# Patient Record
Sex: Female | Born: 1975 | Race: White | Hispanic: No | Marital: Married | Smoking: Former smoker
Health system: Southern US, Community
[De-identification: ages and names within clinical notes are randomized; demographics above are authoritative.]

## PROBLEM LIST (undated history)

## (undated) DIAGNOSIS — F32A Depression, unspecified: Secondary | ICD-10-CM

## (undated) DIAGNOSIS — K5792 Diverticulitis of intestine, part unspecified, without perforation or abscess without bleeding: Secondary | ICD-10-CM

## (undated) DIAGNOSIS — F419 Anxiety disorder, unspecified: Secondary | ICD-10-CM

## (undated) DIAGNOSIS — K219 Gastro-esophageal reflux disease without esophagitis: Secondary | ICD-10-CM

## (undated) HISTORY — DX: Diverticulitis of intestine, part unspecified, without perforation or abscess without bleeding: K57.92

## (undated) HISTORY — DX: Anxiety disorder, unspecified: F41.9

## (undated) HISTORY — DX: Gastro-esophageal reflux disease without esophagitis: K21.9

## (undated) HISTORY — DX: Depression, unspecified: F32.A

---

## 2013-05-21 HISTORY — PX: REFRACTIVE SURGERY: SHX103

## 2018-05-21 HISTORY — PX: COLONOSCOPY: SHX174

## 2020-11-18 HISTORY — PX: ESOPHAGOGASTRODUODENOSCOPY: SHX1529

## 2021-03-20 ENCOUNTER — Encounter: Payer: Self-pay | Admitting: Family Medicine

## 2021-03-20 ENCOUNTER — Other Ambulatory Visit: Payer: Self-pay

## 2021-03-20 ENCOUNTER — Ambulatory Visit
Admission: RE | Admit: 2021-03-20 | Discharge: 2021-03-20 | Disposition: A | Discharge status: Home / Self Care | Payer: BC Managed Care – PPO | Attending: Family Medicine | Admitting: Family Medicine

## 2021-03-20 ENCOUNTER — Ambulatory Visit (INDEPENDENT_AMBULATORY_CARE_PROVIDER_SITE_OTHER): Payer: BC Managed Care – PPO | Admitting: Family Medicine

## 2021-03-20 ENCOUNTER — Ambulatory Visit
Admission: RE | Admit: 2021-03-20 | Discharge: 2021-03-20 | Disposition: A | Discharge status: Home / Self Care | Payer: BC Managed Care – PPO | Source: Ambulatory Visit | Attending: Family Medicine | Admitting: Family Medicine

## 2021-03-20 VITALS — BP 136/92 | HR 60 | Temp 97.9°F | Ht 66.0 in | Wt 187.0 lb

## 2021-03-20 DIAGNOSIS — M25551 Pain in right hip: Secondary | ICD-10-CM

## 2021-03-20 DIAGNOSIS — R030 Elevated blood-pressure reading, without diagnosis of hypertension: Secondary | ICD-10-CM | POA: Insufficient documentation

## 2021-03-20 DIAGNOSIS — M160 Bilateral primary osteoarthritis of hip: Secondary | ICD-10-CM | POA: Diagnosis not present

## 2021-03-20 DIAGNOSIS — M25552 Pain in left hip: Secondary | ICD-10-CM

## 2021-03-20 MED ORDER — MELOXICAM 15 MG PO TABS: 15.0000 mg | ORAL_TABLET | Freq: Every day | ORAL | 0 refills | Status: DC

## 2021-03-20 NOTE — Patient Instructions (Signed)
-   Obtain x-rays downstairs - Start meloxicam, dose daily with food x2 weeks, then daily as needed - If any stomach upset or increased bruising noted, stop meloxicam and contact our office - Can contact number below to start physical therapy - Return for follow-up in 5 weeks, contact us for any questions between now and then  [facility] Physical Therapy:  [place]:  [phone]

## 2021-03-20 NOTE — Assessment & Plan Note (Signed)
Chronic issue in bilateral hips that is somewhat stable, right with possible exacerbation.  Has previous MRI and [place], per patient, showing osteoarthritis.  Pain is primarily anterior without radiation, noted with increasing activity towards the end of the day, treatment in the past has included PT and rest.  Examination today shows significant reduction internal rotation of the right, limited on the left, tenderness at the right greater trochanter and throughout the gluteus medius, secondarily to the left greater than right SI joints, negative straight leg raise, FADIR testing without pain though with limitation on ROM.  Plan for dedicated updated x-rays of bilateral hips and AP pelvis, initiation of 2 weeks of meloxicam followed by as needed dosing, formal PT, and follow-up in 5 weeks to assess interval change.  Pending x-rays and clinical picture, can further escalate with continued medications, change in medications, local ultrasound-guided injections.

## 2021-03-20 NOTE — Assessment & Plan Note (Signed)
Asymptomatic from a cardiopulmonary standpoint, examination reveals no bruits, JVD, positive S1 and S2, regular rate and rhythm, no additional heart sounds, clear lung fields bilaterally without wheezes, rales, rhonchi.  We will monitor this issue at her return in 5 weeks for her annual physical.

## 2021-03-20 NOTE — Progress Notes (Signed)
Primary Care / Sports Medicine Office Visit  Patient Information:  Patient ID: Alexandra Salinas, female DOB: 09-Jun-1975 Age: 45 y.o. MRN: [id number]   Alexandra Salinas is a pleasant 45 y.o. female presenting with the following:  Chief Complaint  Patient presents with   New Patient (Initial Visit)   Establish Care    Moved from [place] 12/2020   Gastroesophageal Reflux    EGD 11/2020; taking famotidine 20 mg bid with relief   Hip Pain    Bilateral; x8 years; MRI 6 years ago and was diagnosed with congenital osteoarthritis; not currently on treatment, but history of doing PT; limited ROM; denies pain in office    Review of Systems pertinent details above   Patient Active Problem List   Diagnosis Date Noted   Pain of both hip joints 03/20/2021   Elevated systolic blood pressure reading without diagnosis of hypertension 03/20/2021   Past Medical History:  Diagnosis Date   Anxiety    Depression    Diverticulitis    GERD (gastroesophageal reflux disease)    Outpatient Encounter Medications as of 03/20/2021  Medication Sig   busPIRone (BUSPAR) 5 MG tablet Take 5 mg by mouth 2 (two) times daily.   escitalopram (LEXAPRO) 20 MG tablet Take 20 mg by mouth daily. Dr. Kenn File with [facility]   famotidine (PEPCID) 20 MG tablet Take 20 mg by mouth 2 (two) times daily.   meloxicam (MOBIC) 15 MG tablet Take 1 tablet (15 mg total) by mouth daily.   No facility-administered encounter medications on file as of 03/20/2021.   Past Surgical History:  Procedure Laterality Date   CESAREAN SECTION  2021   COLONOSCOPY  2020   ESOPHAGOGASTRODUODENOSCOPY  11/2020   REFRACTIVE SURGERY Bilateral 2015    Vitals:   03/20/21 0833  BP: (!) 136/92  Pulse: 60  Temp: 97.9 F (36.6 C)  SpO2: 98%   Vitals:   03/20/21 0833  Weight: 187 lb (84.8 kg)  Height: 5\' 6"  (1.676 m)   Body mass index is 30.18 kg/m.  No results found.   Independent interpretation of notes and tests performed  by another provider:   None  Procedures performed:   None  Pertinent History, Exam, Impression, and Recommendations:   Elevated systolic blood pressure reading without diagnosis of hypertension Asymptomatic from a cardiopulmonary standpoint, examination reveals no bruits, JVD, positive S1 and S2, regular rate and rhythm, no additional heart sounds, clear lung fields bilaterally without wheezes, rales, rhonchi.  We will monitor this issue at her return in 5 weeks for her annual physical.  Pain of both hip joints Chronic issue in bilateral hips that is somewhat stable, right with possible exacerbation.  Has previous MRI and x-ray [place]state, per patient, showing osteoarthritis.  Pain is primarily anterior without radiation, noted with increasing activity towards the end of the day, treatment in the past has included PT and rest.  Examination today shows significant reduction internal rotation of the right, limited on the left, tenderness at the right greater trochanter and throughout the gluteus medius, secondarily to the left greater than right SI joints, negative straight leg raise, FADIR testing without pain though with limitation on ROM.  Plan for dedicated updated x-rays of bilateral hips and AP pelvis, initiation of 2 weeks of meloxicam followed by as needed dosing, formal PT, and follow-up in 5 weeks to assess interval change.  Pending x-rays and clinical picture, can further escalate with continued medications, change in medications, local ultrasound-guided injections.  Orders & Medications Meds ordered this encounter  Medications   meloxicam (MOBIC) 15 MG tablet    Sig: Take 1 tablet (15 mg total) by mouth daily.    Dispense:  30 tablet    Refill:  0   Orders Placed This Encounter  Procedures   DG Hip Unilat W OR W/O Pelvis Min 4 Views Right   DG Hip Unilat W OR W/O Pelvis 2-3 Views Left   Ambulatory referral to Physical Therapy     Return in about 5 weeks (around  04/24/2021).     Jerrol Banana, MD   Primary Care Sports Medicine [facility]

## 2021-03-27 ENCOUNTER — Ambulatory Visit: Payer: BC Managed Care – PPO | Attending: Family Medicine

## 2021-03-27 ENCOUNTER — Other Ambulatory Visit: Payer: Self-pay

## 2021-03-27 DIAGNOSIS — M25552 Pain in left hip: Secondary | ICD-10-CM | POA: Diagnosis not present

## 2021-03-27 DIAGNOSIS — M6281 Muscle weakness (generalized): Secondary | ICD-10-CM | POA: Diagnosis not present

## 2021-03-27 DIAGNOSIS — M25651 Stiffness of right hip, not elsewhere classified: Secondary | ICD-10-CM | POA: Insufficient documentation

## 2021-03-27 DIAGNOSIS — M25551 Pain in right hip: Secondary | ICD-10-CM | POA: Insufficient documentation

## 2021-03-27 NOTE — Therapy (Signed)
[facility] [address]. [place], [place], [address] Phone: [phone]   Fax:  [phone]  Physical Therapy Evaluation  Patient Details  Name: Alexandra Salinas MRN: [id number] Date of Birth: 1976-04-09 Referring Provider (PT): Dr Joseph Berkshire  Encounter Date: 03/27/2021   PT End of Session - 03/27/21 1354     Visit Number 1    Number of Visits 17    Date for PT Re-Evaluation 05/22/21    Authorization Type eval: 03/27/21    PT Start Time 0935    PT Stop Time 1015    PT Time Calculation (min) 40 min    Activity Tolerance Patient tolerated treatment well    Behavior During Therapy [facility] for tasks assessed/performed             Past Medical History:  Diagnosis Date   Anxiety    Depression    Diverticulitis    GERD (gastroesophageal reflux disease)     Past Surgical History:  Procedure Laterality Date   CESAREAN SECTION  2021   COLONOSCOPY  2020   ESOPHAGOGASTRODUODENOSCOPY  11/2020   REFRACTIVE SURGERY Bilateral 2015    There were no vitals filed for this visit.    Subjective Assessment - 03/27/21 1349     Subjective R hip pain    Pertinent History Pt has a history of bilateral hip pain which began around 8 years ago. It started in her L hip when she was running while training for a 5K race. The L hip pain resolved and has not bothered her since that initial episode. No L hip pain currently but she has been having some intermittent pain in her R hip. Pain in the R hip is located deep in her R groin. Per patient's report she has had a previous MRI and x-ray out-of-state showing osteoarthritis. Dr. Ashley Royalty repeated plain film imaging of her hips which on the right showed no acute fracture or dislocation. There is mild to moderate  arthritic changes of the right hip with joint space narrowing and spurring. Bone spur and osteophyte noted at the right femoral neck. The bones are well mineralized. The soft tissues are  unremarkable. She denies any radiation of her pain or BLE numbness/tingling. Dr. Ashley Royalty started her on meloxicam which she has been on for multiple days without any reported adverse side effects. She has had PT in the past which focused primarily on range of motion and worsened her pain. She only attended 3 sessions. Pt also reporting pain with sexual penetration which started prior to having her daughter 3.5 years ago but worsened afterwards and has persisted. She delivered her daughter via cesarean section. Pt denies bowel/bladder incontinence issues. No pain with bowel movements. ROS negative for red flags.    Limitations Other (comment)   Squating   How long can you sit comfortably? unlimited but stiffness in the R hip once getting up    How long can you stand comfortably? unlimited    How long can you walk comfortably? unlimited    Diagnostic tests see history    Patient Stated Goals Return to running, be able to put on pants and socks easier    Currently in Pain? No/denies    Pain Score 0-No pain   Worst: 6/10   Pain Location Hip    Pain Orientation Right    Pain Descriptors / Indicators Aching;Tightness    Pain Type Chronic pain    Pain Radiating Towards Denies  Pain Onset More than a month ago    Pain Frequency Intermittent    Aggravating Factors  Running (particularly on hard surfaces), squats, extended sitting;    Pain Relieving Factors activity helps pain but otherwise pt is unsure    Effect of Pain on Daily Activities Pain improves with activity                [facility] PT Assessment - 03/27/21 1352       Assessment   Medical Diagnosis Pain in both hip joints    Referring Provider (PT) Dr Joseph Berkshire    Onset Date/Surgical Date 03/26/13   Approximate   Hand Dominance Right    Next MD Visit 04/25/21    Prior Therapy started to hurt so she stopped, went to approximately 3 visits with a lot of R hip PROM      Precautions   Precautions None      Restrictions    Weight Bearing Restrictions No      Balance Screen   Has the patient fallen in the past 6 months No      Home Environment   Living Environment Private residence    Living Arrangements Spouse/significant other;Children    Available Help at Discharge Family                SUBJECTIVE Chief complaint:  R hip pain History: Pt has a history of bilateral hip pain which began around 8 years ago. It started in her L hip when she was running while training for a 5K race. The L hip pain resolved and has not bothered her since that initial episode. No L hip pain currently but she has been having some intermittent pain in her R hip. Pain in the R hip is located deep in her R groin. Per patient's report she has had a previous MRI and x-ray out-of-state showing osteoarthritis. Dr. Ashley Royalty repeated plain film imaging of her hips which on the right showed no acute fracture or dislocation. There is mild to moderate  arthritic changes of the right hip with joint space narrowing and spurring. Bone spur and osteophyte noted at the right femoral neck. The bones are well mineralized. The soft tissues are unremarkable. She denies any radiation of her pain or BLE numbness/tingling. Dr. Ashley Royalty started her on meloxicam which she has been on for multiple days without any reported adverse side effects. She has had PT in the past which focused primarily on range of motion and worsened her pain. She only attended 3 sessions. Pt also reporting pain with sexual penetration which started prior to having her daughter 3.5 years ago but worsened afterwards and has persisted. She delivered her daughter via cesarean section. Pt denies bowel/bladder incontinence issues. No pain with bowel movements. ROS negative for red flags.  Red flags (bowel/bladder changes, saddle paresthesia, personal history of cancer, chills/fever, night sweats, unrelenting pain, first onset of insidious LBP <20 y/o) Negative Referring Dx: Bilateral hip  pain Referring Provider: Dr. Ashley Royalty Pain location: R deep groin pain; Pain: Present 0/10, Best 0/10, Worst: 6/10 Pain quality: aching, tightness Radiating pain: No  Numbness/Tingling: No 24 hour pain behavior: Stiff in the AM Aggravating factors: Running (particularly on hard surfaces), squats, extended sitting; Easing factors: activity helps pain but otherwise pt is unsure How long can you sit: unlimited but stiffness in the R hip once getting up How long can you stand: unlimited How long can you walk: unlimited History of prior hip injury, pain, surgery, or  therapy: Yes, no injury but history of physical therapy (started to hurt so she stopped, went to approximately 3 visits with a lot of R hip PROM) Follow-up appointment with MD: Yes, April 25, 2021 Dominant hand: right Imaging: Yes, see history   Falls in the last 6 months: No  Occupational demands: Pt works on [employer], sitting most of the day Hobbies: exercises, hiking, going to parks, play with daughter (57.55 years old); Goals: Return to running, improved hip motion to be able to put pants and socks on;    OBJECTIVE  Mental Status Patient is oriented to person, place and time.  Recent memory is intact.  Remote memory is intact.  Attention span and concentration are intact.  Expressive speech is intact.  Patient's fund of knowledge is within normal limits for educational level.   Gross Musculoskeletal Assessment Tremor: None Bulk: Normal Tone: Normal No visible step-off along spinal column   Gait Deferred   Posture Lumbar lordosis: WNL Iliac crest height: equal bilaterally Lumbar lateral shift: negative Lower crossed syndrome (tight hip flexors and erector spinae; weak gluts and abs): positive   AROM (degrees) R/L (all movements include overpressure unless otherwise stated) Lumbar forward flexion (65): WNL Lumbar extension (30): WNL Lumbar lateral flexion (25): R: min loss  L: min loss Thoracic and  Lumbar rotation (30 degrees):  R: min loss L: min loss Hip IR (0-45): R: 15  L: 23 Hip ER (0-45): R: 35  L: 60 Hip Flexion (0-125): R: 103, L: 113 Hip Abduction (0-40): R: 24  L: 41 Hip extension (0-15): R: mod limitation; L: min limitation *Indicates pain   Repeated Movements No centralization or peripheralization of symptoms with repeated lumbar extension or flexion.    Strength (out of 5) R/L 4+/5 Hip flexion 5/5 Hip ER 5/5 Hip IR 4-/4 Hip abduction 4+/5 Hip adduction 4+/5 Hip extension 5/5 Knee extension 5/5 Knee flexion 5/5 Ankle dorsiflexion Active bilateral contraction ankle plantarflexion *Indicates pain   Sensation Deferred   Reflexes Deferred   Palpation Pain with palpation to lateral R thigh and anterior groin. No pain over gluteus medius or over R greater trochanter.    Muscle Length Hamstrings: R: 78 degrees L: 85 degrees  E[place]: More restricted on the right; Thomas: L: Negative Ober: Deferred    Passive Accessory Intervertebral Motion (PAIVM) Pt denies reproduction of hip pain with CPA L1-L5 and UPA bilaterally L1-L5. Generally hypomobile throughout with some intermittent point tenderness;    SPECIAL TESTS Lumbar Radiculopathy and Discogenic: Centralization and Peripheralization (SN 92, -LR 0.12): Negative Slump (SN 83, -LR 0.32): R: Negative L: Negative SLR (SN 92, -LR 0.29): R: Negative L:  Negative Crossed SLR (SP 90): R: Negative L: Negative   Facet Joint: Extension-Rotation (SN 100, -LR 0.0): R: Negative L: Negative   Lumbar Foraminal Stenosis: Lumbar quadrant (SN 70): R: Negative L: Negative   Hip: FABER (SN 81): R: Negative L: Negative FADIR (SN 94): R: Negative L: Negative Hip scour (SN 50): R: Negative L: Negative   SIJ:  Thigh Thrust (SN 88, -LR 0.18) : R: Not examined L: Not examined   Piriformis Syndrome: FAIR Test (SN 88, SP 83): R: Not examined L: Not examined   Functional  Tasks Deferred              Objective measurements completed on examination: See above findings.                PT Education - 03/27/21 1354     Education  Details Plan of care    Person(s) Educated Patient    Methods Explanation    Comprehension Verbalized understanding              PT Short Term Goals - 03/27/21 1419       PT SHORT TERM GOAL #1   Title Pt will be independent with HEP in order to improve strength and decrease hip pain in order to improve pain-free function at home, work, and with leisure activities    Time 4    Period Weeks    Status New    Target Date 04/24/21               PT Long Term Goals - 03/27/21 1420       PT LONG TERM GOAL #1   Title Pt will decrease worst R hip pain as reported on NPRS by at least 3 points in order to demonstrate clinically significant reduction in hip pain.    Baseline 03/27/21: worst: 6/10    Time 8    Period Weeks    Status New    Target Date 05/22/21      PT LONG TERM GOAL #2   Title Pt will increase strength of R hip abduction by at least 1/2 MMT grade in order to demonstrate improvement in strength and function related to pelvic stability    Baseline 03/27/21: R hip abduction: 4-/5    Time 8    Period Weeks    Status New    Target Date 05/22/21      PT LONG TERM GOAL #3   Title Pt will report at least 50% improvement in R hip symptoms in order to improve her ability to squat, run, and put on her pants and socks    Time 8    Period Weeks    Status New    Target Date 05/22/21      PT LONG TERM GOAL #4   Title Pt will improve R hip internal and external range of motion by at least 10 degrees each in order to improve ability to don pants and socks    Baseline 03/27/21: R hip IR: 15, ER: 35    Time 8    Period Weeks    Status New    Target Date 05/22/21                    Plan - 03/27/21 1417     Clinical Impression Statement Pt is a pleasant 45 year-old female  referred for bilateral hip pain. Pt reports that her L hip actually hasn't hurt since the initial episode 8 years ago. Unable to reproduce L hip pain during examination. Patient's primary concern today is R hip pain which she describes as pain deep in her groin. She present with weakness in R hip compared to L hip today during manual muscle testing. She also has significant range of motion deficits in R hip compared to L hip, most notably in internal rotation. Pain with palpation along R IT band as well as near insertion of pectineus/iliopsoas in anterior groin. She reports pain with vaginal penetration which started prior to the birth of her daughter 3.5 years ago sporadically but is much more regular now. Patient delivered her daughter by cesarean section.  She states that the pain is not related to positioning of her R hip. Therapist spoke with pelvic health PT who agreed to consult on patient if pain does not improve with general R  hip therapy for improvement in strength and range of motion. Pt encouraged to notify her OB/GYN of this issue in December when she goes for her yearly exam. Pt presents with deficits in strength, range of motion, and pain and will benefit from skilled PT services to address these deficits and return to pain-free function at home, work, and with leisure activities such as running.    Personal Factors and Comorbidities Comorbidity 2;Past/Current Experience;Time since onset of injury/illness/exacerbation    Comorbidities anxiety, depression    Examination-Activity Limitations Squat    Examination-Participation Restrictions Community Activity;Other   Running   Stability/Clinical Decision Making Stable/Uncomplicated    Clinical Decision Making Low    Rehab Potential Good    PT Frequency 2x / week    PT Duration 8 weeks    PT Treatment/Interventions ADLs/Self Care Home Management;Aquatic Therapy;Biofeedback;Canalith Repostioning;Cryotherapy;Electrical Stimulation;Iontophoresis  /ml Dexamethasone;Moist Heat;Traction;Ultrasound;Gait training;Stair training;Functional mobility training;Therapeutic activities;Therapeutic exercise;Balance training;Neuromuscular re-education;Patient/family education;Manual techniques;Passive range of motion;Dry needling;Vestibular;Spinal Manipulations;Joint Manipulations    PT Next Visit Plan Issue HEP, initiate manual techniques/stretching/strengthening;    PT Home Exercise Plan None currently    Recommended Other Services Possible consult with pelvic health PT    Consulted and Agree with Plan of Care Patient             Patient will benefit from skilled therapeutic intervention in order to improve the following deficits and impairments:  Decreased range of motion, Pain  Visit Diagnosis: Pain in right hip  Muscle weakness (generalized)  Stiffness of right hip, not elsewhere classified     Problem List Patient Active Problem List   Diagnosis Date Noted   Pain of both hip joints 03/20/2021   Elevated systolic blood pressure reading without diagnosis of hypertension 03/20/2021   Lynnea Maizes PT, DPT, GCS  Layci Stenglein, PT 03/27/2021, 3:56 PM  [facility] [address] [place], [place], [address] Phone: [phone]   Fax:  [phone]  Name: Zeah Germano MRN: [id number] Date of Birth: Aug 08, 1975

## 2021-03-30 ENCOUNTER — Other Ambulatory Visit: Payer: Self-pay

## 2021-03-30 ENCOUNTER — Ambulatory Visit: Payer: BC Managed Care – PPO

## 2021-03-30 DIAGNOSIS — M25551 Pain in right hip: Secondary | ICD-10-CM

## 2021-03-30 DIAGNOSIS — M25552 Pain in left hip: Secondary | ICD-10-CM | POA: Diagnosis not present

## 2021-03-30 DIAGNOSIS — M6281 Muscle weakness (generalized): Secondary | ICD-10-CM | POA: Diagnosis not present

## 2021-03-30 DIAGNOSIS — M25651 Stiffness of right hip, not elsewhere classified: Secondary | ICD-10-CM

## 2021-03-30 NOTE — Patient Instructions (Addendum)
Access Code: [email or web address] URL: [email or web address] Date: 03/30/2021 Prepared by: Ria Comment  Exercises Supine Bridge - 1 x daily - 7 x weekly - 2 sets - 10 reps - 3s hold Sidelying Hip Abduction - 1 x daily - 7 x weekly - 2 sets - 10 reps - 3s hold Seated Hip Abduction with Resistance - 1 x daily - 7 x weekly - 2 sets - 10 reps - 3s hold Standing Hip Flexor Stretch - 2 x daily - 7 x weekly - 3 reps - 45s hold Seated Figure 4 Piriformis Stretch - 2 x daily - 7 x weekly - 3 reps - 45s hold

## 2021-03-30 NOTE — Therapy (Signed)
[facility] [address] [place], [place], [address] Phone: [phone]   Fax:  [phone]  Physical Therapy Treatment  Patient Details  Name: Alexandra Salinas MRN: [id number] Date of Birth: July 09, 1975 Referring Provider (PT): Dr Joseph Berkshire   Encounter Date: 03/30/2021   PT End of Session - 03/30/21 0937     Visit Number 2    Number of Visits 17    Date for PT Re-Evaluation 05/22/21    Authorization Type eval: 03/27/21    PT Start Time 0933    PT Stop Time 1015    PT Time Calculation (min) 42 min    Activity Tolerance Patient tolerated treatment well    Behavior During Therapy [facility] for tasks assessed/performed             Past Medical History:  Diagnosis Date   Anxiety    Depression    Diverticulitis    GERD (gastroesophageal reflux disease)     Past Surgical History:  Procedure Laterality Date   CESAREAN SECTION  2021   COLONOSCOPY  2020   ESOPHAGOGASTRODUODENOSCOPY  11/2020   REFRACTIVE SURGERY Bilateral 2015    There were no vitals filed for this visit.   Subjective Assessment - 03/30/21 0936     Subjective Pt reports that she is doing well today. Denies any resting hip pain upon arrival but reports that it has been hurting intermittently. No specific questions or concerns currently.    Pertinent History Pt has a history of bilateral hip pain which began around 8 years ago. It started in her L hip when she was running while training for a 5K race. The L hip pain resolved and has not bothered her since that initial episode. No L hip pain currently but she has been having some intermittent pain in her R hip. Pain in the R hip is located deep in her R groin. Per patient's report she has had a previous MRI and [place] showing osteoarthritis. Dr. Ashley Royalty repeated plain film imaging of her hips which on the right showed no acute fracture or dislocation. There is mild to moderate  arthritic changes  of the right hip with joint space narrowing and spurring. Bone spur and osteophyte noted at the right femoral neck. The bones are well mineralized. The soft tissues are unremarkable. She denies any radiation of her pain or BLE numbness/tingling. Dr. Ashley Royalty started her on meloxicam which she has been on for multiple days without any reported adverse side effects. She has had PT in the past which focused primarily on range of motion and worsened her pain. She only attended 3 sessions. Pt also reporting pain with sexual penetration which started prior to having her daughter 3.5 years ago but worsened afterwards and has persisted. She delivered her daughter via cesarean section. Pt denies bowel/bladder incontinence issues. No pain with bowel movements. ROS negative for red flags.    Limitations Other (comment)   Squating   How long can you sit comfortably? unlimited but stiffness in the R hip once getting up    How long can you stand comfortably? unlimited    How long can you walk comfortably? unlimited    Diagnostic tests see history    Patient Stated Goals Return to running, be able to put on pants and socks easier    Currently in Pain? No/denies                   TREATMENT  Manual Therapy  R hip flexor stretch off side of table with therapist blocking pelvis and pt performing posterior pelvic tilt 2 x 60s; Supine R hip figure 4 stretch 2 x60s; Supine right femur on pelvis anterior to posterior mobilizations, grade 3, 30 seconds per about x3 bouts; Supine right hip inferior long axis distraction with belt assist by therapist 30 seconds x 2; Supine right hip inferior mobilizations with belt assist and hip flexed at 90 degrees, grade 3, 30 seconds per bout x3 bouts; Supine right hip medial lateral mobilizations with belt assist and hip flexed 45 degrees grade 3, 30 seconds per about x3 bouts; Supine right hip inferior mobilizations with belt assisted hip in FABER position, grade 3, 30  seconds per bout x2 bouts;   Ther-ex  NuStep L2-6 x 5 minutes for warm up during interim history (3 minutes unbilled); Hook lying clams manual resistance from therapist 2x10; Hook lying bridges 2x10; L sidelying straight leg hip abduction 2 x 10; HEP issued with handout and education provided;   Pt educated throughout session about proper posture and technique with exercises. Improved exercise technique, movement at target joints, use of target muscles after min to mod verbal, visual, tactile cues.    Patient demonstrates excellent motivation during session today.  Initiated right hip manual techniques and stretching during session today.  Utilized belt assist mobilizations to improve range of motion.  Initiated right hip strengthening with patient and issued HEP.  Patient encouraged to follow-up as scheduled.  Patient will benefit from skilled PT services to address deficits in right hip pain and range of motion order improve function at home and with leisure activities.                     PT Short Term Goals - 03/27/21 1419       PT SHORT TERM GOAL #1   Title Pt will be independent with HEP in order to improve strength and decrease hip pain in order to improve pain-free function at home, work, and with leisure activities    Time 4    Period Weeks    Status New    Target Date 04/24/21               PT Long Term Goals - 03/27/21 1420       PT LONG TERM GOAL #1   Title Pt will decrease worst R hip pain as reported on NPRS by at least 3 points in order to demonstrate clinically significant reduction in hip pain.    Baseline 03/27/21: worst: 6/10    Time 8    Period Weeks    Status New    Target Date 05/22/21      PT LONG TERM GOAL #2   Title Pt will increase strength of R hip abduction by at least 1/2 MMT grade in order to demonstrate improvement in strength and function related to pelvic stability    Baseline 03/27/21: R hip abduction: 4-/5    Time 8     Period Weeks    Status New    Target Date 05/22/21      PT LONG TERM GOAL #3   Title Pt will report at least 50% improvement in R hip symptoms in order to improve her ability to squat, run, and put on her pants and socks    Time 8    Period Weeks    Status New    Target Date 05/22/21  PT LONG TERM GOAL #4   Title Pt will improve R hip internal and external range of motion by at least 10 degrees each in order to improve ability to don pants and socks    Baseline 03/27/21: R hip IR: 15, ER: 35    Time 8    Period Weeks    Status New    Target Date 05/22/21                   Plan - 03/30/21 5462     Clinical Impression Statement Patient demonstrates excellent motivation during session today.  Initiated right hip manual techniques and stretching during session today.  Utilized belt assist mobilizations to improve range of motion.  Initiated right hip strengthening with patient and issued HEP.  Patient encouraged to follow-up as scheduled.  Patient will benefit from skilled PT services to address deficits in right hip pain and range of motion order improve function at home and with leisure activities.    Personal Factors and Comorbidities Comorbidity 2;Past/Current Experience;Time since onset of injury/illness/exacerbation    Comorbidities anxiety, depression    Examination-Activity Limitations Squat    Examination-Participation Restrictions Community Activity;Other   Running   Stability/Clinical Decision Making Stable/Uncomplicated    Rehab Potential Good    PT Frequency 2x / week    PT Duration 8 weeks    PT Treatment/Interventions ADLs/Self Care Home Management;Aquatic Therapy;Biofeedback;Canalith Repostioning;Cryotherapy;Electrical Stimulation;Iontophoresis 4mg /ml Dexamethasone;Moist Heat;Traction;Ultrasound;Gait training;Stair training;Functional mobility training;Therapeutic activities;Therapeutic exercise;Balance training;Neuromuscular re-education;Patient/family  education;Manual techniques;Passive range of motion;Dry needling;Vestibular;Spinal Manipulations;Joint Manipulations    PT Next Visit Plan Review HEP, progress manual techniques/stretching, progress strengthening    PT Home Exercise Plan Access Code: [email or web address]    Consulted and Agree with Plan of Care Patient             Patient will benefit from skilled therapeutic intervention in order to improve the following deficits and impairments:  Decreased range of motion, Pain  Visit Diagnosis: Pain in right hip  Muscle weakness (generalized)  Stiffness of right hip, not elsewhere classified     Problem List Patient Active Problem List   Diagnosis Date Noted   Pain of both hip joints 03/20/2021   Elevated systolic blood pressure reading without diagnosis of hypertension 03/20/2021   03/22/2021 PT, DPT, GCS  Jianni Batten, PT 03/30/2021, 1:31 PM  [place] [facility] [address] [place], [place], [place] Phone: [phone]   Fax:  [phone]  Name: Averie Hornbaker MRN: Ezequiel Ganser Date of Birth: 1976/04/10

## 2021-04-03 ENCOUNTER — Encounter: Payer: Self-pay | Admitting: Family Medicine

## 2021-04-03 ENCOUNTER — Other Ambulatory Visit: Payer: Self-pay

## 2021-04-03 ENCOUNTER — Ambulatory Visit: Payer: BC Managed Care – PPO

## 2021-04-03 DIAGNOSIS — M6281 Muscle weakness (generalized): Secondary | ICD-10-CM | POA: Diagnosis not present

## 2021-04-03 DIAGNOSIS — K2270 Barrett's esophagus without dysplasia: Secondary | ICD-10-CM

## 2021-04-03 DIAGNOSIS — M25551 Pain in right hip: Secondary | ICD-10-CM

## 2021-04-03 DIAGNOSIS — M25552 Pain in left hip: Secondary | ICD-10-CM | POA: Diagnosis not present

## 2021-04-03 DIAGNOSIS — M25651 Stiffness of right hip, not elsewhere classified: Secondary | ICD-10-CM

## 2021-04-03 NOTE — Therapy (Signed)
[facility] [address] [place], [place], [address] Phone: [phone]   Fax:  [phone]  Physical Therapy Treatment  Patient Details  Name: Alexandra Salinas MRN: [id number] Date of Birth: 09/18/1975 Referring Provider (PT): Dr Joseph Berkshire   Encounter Date: 04/03/2021   PT End of Session - 04/03/21 1542     Visit Number 3    Number of Visits 17    Date for PT Re-Evaluation 05/22/21    Authorization Type eval: 03/27/21    PT Start Time 0933    PT Stop Time 1015    PT Time Calculation (min) 42 min    Activity Tolerance Patient tolerated treatment well    Behavior During Therapy [facility] for tasks assessed/performed              Past Medical History:  Diagnosis Date   Anxiety    Depression    Diverticulitis    GERD (gastroesophageal reflux disease)     Past Surgical History:  Procedure Laterality Date   CESAREAN SECTION  2021   COLONOSCOPY  2020   ESOPHAGOGASTRODUODENOSCOPY  11/2020   REFRACTIVE SURGERY Bilateral 2015    There were no vitals filed for this visit.   Subjective Assessment - 04/03/21 0942     Subjective Pt reports that she is doing well today. Denies any resting hip pain upon arrival and states that she has not been having pain since her last visit. No specific questions or concerns currently.    Pertinent History Pt has a history of bilateral hip pain which began around 8 years ago. It started in her L hip when she was running while training for a 5K race. The L hip pain resolved and has not bothered her since that initial episode. No L hip pain currently but she has been having some intermittent pain in her R hip. Pain in the R hip is located deep in her R groin. Per patient's report she has had a previous MRI and [place] showing osteoarthritis. Dr. Ashley Royalty repeated plain film imaging of her hips which on the right showed no acute fracture or dislocation. There is mild to moderate   arthritic changes of the right hip with joint space narrowing and spurring. Bone spur and osteophyte noted at the right femoral neck. The bones are well mineralized. The soft tissues are unremarkable. She denies any radiation of her pain or BLE numbness/tingling. Dr. Ashley Royalty started her on meloxicam which she has been on for multiple days without any reported adverse side effects. She has had PT in the past which focused primarily on range of motion and worsened her pain. She only attended 3 sessions. Pt also reporting pain with sexual penetration which started prior to having her daughter 3.5 years ago but worsened afterwards and has persisted. She delivered her daughter via cesarean section. Pt denies bowel/bladder incontinence issues. No pain with bowel movements. ROS negative for red flags.    Limitations Other (comment)   Squating   How long can you sit comfortably? unlimited but stiffness in the R hip once getting up    How long can you stand comfortably? unlimited    How long can you walk comfortably? unlimited    Diagnostic tests see history    Patient Stated Goals Return to running, be able to put on pants and socks easier    Currently in Pain? No/denies  TREATMENT   Manual Therapy  R hip flexor stretch off side of table with therapist blocking pelvis and pt performing posterior pelvic tilt 2 x 60s; Supine R hip figure 4 stretch 2 x 60s; Supine R hip FADIR stretch 2 x 60s; Supine right hip inferior long axis distraction with belt assist by therapist 30 seconds x 2; Supine right hip inferior mobilizations with belt assist and hip flexed at 90 degrees, grade 3, 30 seconds per bout x3 bouts; Supine right hip medial lateral mobilizations with belt assist and hip flexed 45 degrees grade 3, 30 seconds per about x3 bouts; Supine right hip inferior mobilizations with belt assisted hip in FABER position, grade 3, 30 seconds per bout x2 bouts;   Ther-ex  NuStep  L2-6 x 5 minutes for warm up during interim history (3 minutes unbilled); R figure 4 single leg bridges 2 x 10; Total Gym Level 22 R single leg partial squats 2 x 10; TRX curtsy lunge 2 x 10 toward each side;   Pt educated throughout session about proper posture and technique with exercises. Improved exercise technique, movement at target joints, use of target muscles after min to mod verbal, visual, tactile cues.    Patient demonstrates excellent motivation during session today. Continued right hip manual techniques and stretching during session today.  Utilized belt assist mobilizations to improve range of motion.  Continued right hip strengthening with patient and progressed to additional weightbearing exercises such as Total Gym single leg squats and TRX curtsy lunges.  Patient encouraged to follow-up as scheduled.  Patient will benefit from skilled PT services to address deficits in right hip pain and range of motion order improve function at home and with leisure activities.                      PT Short Term Goals - 03/27/21 1419       PT SHORT TERM GOAL #1   Title Pt will be independent with HEP in order to improve strength and decrease hip pain in order to improve pain-free function at home, work, and with leisure activities    Time 4    Period Weeks    Status New    Target Date 04/24/21               PT Long Term Goals - 03/27/21 1420       PT LONG TERM GOAL #1   Title Pt will decrease worst R hip pain as reported on NPRS by at least 3 points in order to demonstrate clinically significant reduction in hip pain.    Baseline 03/27/21: worst: 6/10    Time 8    Period Weeks    Status New    Target Date 05/22/21      PT LONG TERM GOAL #2   Title Pt will increase strength of R hip abduction by at least 1/2 MMT grade in order to demonstrate improvement in strength and function related to pelvic stability    Baseline 03/27/21: R hip abduction: 4-/5    Time  8    Period Weeks    Status New    Target Date 05/22/21      PT LONG TERM GOAL #3   Title Pt will report at least 50% improvement in R hip symptoms in order to improve her ability to squat, run, and put on her pants and socks    Time 8    Period Weeks    Status New  Target Date 05/22/21      PT LONG TERM GOAL #4   Title Pt will improve R hip internal and external range of motion by at least 10 degrees each in order to improve ability to don pants and socks    Baseline 03/27/21: R hip IR: 15, ER: 35    Time 8    Period Weeks    Status New    Target Date 05/22/21                   Plan - 04/03/21 1018     Clinical Impression Statement Patient demonstrates excellent motivation during session today. Continued right hip manual techniques and stretching during session today.  Utilized belt assist mobilizations to improve range of motion.  Continued right hip strengthening with patient and progressed to additional weightbearing exercises such as Total Gym single leg squats and TRX curtsy lunges.  Patient encouraged to follow-up as scheduled.  Patient will benefit from skilled PT services to address deficits in right hip pain and range of motion order improve function at home and with leisure activities.    Personal Factors and Comorbidities Comorbidity 2;Past/Current Experience;Time since onset of injury/illness/exacerbation    Comorbidities anxiety, depression    Examination-Activity Limitations Squat    Examination-Participation Restrictions Community Activity;Other   Running   Stability/Clinical Decision Making Stable/Uncomplicated    Rehab Potential Good    PT Frequency 2x / week    PT Duration 8 weeks    PT Treatment/Interventions ADLs/Self Care Home Management;Aquatic Therapy;Biofeedback;Canalith Repostioning;Cryotherapy;Electrical Stimulation;Iontophoresis 4mg /ml Dexamethasone;Moist Heat;Traction;Ultrasound;Gait training;Stair training;Functional mobility  training;Therapeutic activities;Therapeutic exercise;Balance training;Neuromuscular re-education;Patient/family education;Manual techniques;Passive range of motion;Dry needling;Vestibular;Spinal Manipulations;Joint Manipulations    PT Next Visit Plan Review HEP, progress manual techniques/stretching, progress strengthening    PT Home Exercise Plan Access Code: [email or web address]    Consulted and Agree with Plan of Care Patient              Patient will benefit from skilled therapeutic intervention in order to improve the following deficits and impairments:  Decreased range of motion, Pain  Visit Diagnosis: Pain in right hip  Muscle weakness (generalized)  Stiffness of right hip, not elsewhere classified     Problem List Patient Active Problem List   Diagnosis Date Noted   Pain of both hip joints 03/20/2021   Elevated systolic blood pressure reading without diagnosis of hypertension 03/20/2021   03/22/2021 PT, DPT, GCS  Jaymien Landin, PT 04/03/2021, 4:01 PM  [place] [facility] [address] [place], [place], [place] Phone: [phone]   Fax:  [phone]  Name: Alexandra Salinas MRN: Ezequiel Ganser Date of Birth: [birth year]

## 2021-04-06 ENCOUNTER — Other Ambulatory Visit: Payer: Self-pay

## 2021-04-06 ENCOUNTER — Ambulatory Visit: Payer: BC Managed Care – PPO

## 2021-04-06 DIAGNOSIS — M25552 Pain in left hip: Secondary | ICD-10-CM | POA: Diagnosis not present

## 2021-04-06 DIAGNOSIS — M6281 Muscle weakness (generalized): Secondary | ICD-10-CM

## 2021-04-06 DIAGNOSIS — M25551 Pain in right hip: Secondary | ICD-10-CM

## 2021-04-06 DIAGNOSIS — M25651 Stiffness of right hip, not elsewhere classified: Secondary | ICD-10-CM | POA: Diagnosis not present

## 2021-04-06 NOTE — Therapy (Signed)
[facility] [address] [place], [place], [address] Phone: [phone]   Fax:  [phone]  Physical Therapy Treatment  Patient Details  Name: Alexandra Salinas MRN: [id number] Date of Birth: Sep 02, 1975 Referring Provider (PT): Dr Joseph Berkshire   Encounter Date: 04/06/2021   PT End of Session - 04/06/21 0934     Visit Number 4    Number of Visits 17    Date for PT Re-Evaluation 05/22/21    Authorization Type eval: 03/27/21    PT Start Time 0931    PT Stop Time 1015    PT Time Calculation (min) 44 min    Activity Tolerance Patient tolerated treatment well    Behavior During Therapy [facility] for tasks assessed/performed              Past Medical History:  Diagnosis Date   Anxiety    Depression    Diverticulitis    GERD (gastroesophageal reflux disease)     Past Surgical History:  Procedure Laterality Date   CESAREAN SECTION  2021   COLONOSCOPY  2020   ESOPHAGOGASTRODUODENOSCOPY  11/2020   REFRACTIVE SURGERY Bilateral 2015    There were no vitals filed for this visit.   Subjective Assessment - 04/06/21 0933     Subjective Pt reports that she is doing well today. Denies any resting hip pain upon arrival and states that she has not been having pain since her last visit. She has been performing HEP without issue. No specific questions or concerns currently.    Pertinent History Pt has a history of bilateral hip pain which began around 8 years ago. It started in her L hip when she was running while training for a 5K race. The L hip pain resolved and has not bothered her since that initial episode. No L hip pain currently but she has been having some intermittent pain in her R hip. Pain in the R hip is located deep in her R groin. Per patient's report she has had a previous MRI and [place]-ray [place] showing osteoarthritis. Dr. Ashley Royalty repeated plain film imaging of her hips which on the right showed no acute fracture or  dislocation. There is mild to moderate  arthritic changes of the right hip with joint space narrowing and spurring. Bone spur and osteophyte noted at the right femoral neck. The bones are well mineralized. The soft tissues are unremarkable. She denies any radiation of her pain or BLE numbness/tingling. Dr. Ashley Royalty started her on meloxicam which she has been on for multiple days without any reported adverse side effects. She has had PT in the past which focused primarily on range of motion and worsened her pain. She only attended 3 sessions. Pt also reporting pain with sexual penetration which started prior to having her daughter 3.5 years ago but worsened afterwards and has persisted. She delivered her daughter via cesarean section. Pt denies bowel/bladder incontinence issues. No pain with bowel movements. ROS negative for red flags.    Limitations Other (comment)   Squating   How long can you sit comfortably? unlimited but stiffness in the R hip once getting up    How long can you stand comfortably? unlimited    How long can you walk comfortably? unlimited    Diagnostic tests see history    Patient Stated Goals Return to running, be able to put on pants and socks easier    Currently in Pain? No/denies  TREATMENT   Manual Therapy  R hip flexor stretch off side of table with therapist blocking pelvis and pt performing posterior pelvic tilt 2 x 60s; Supine R hip figure 4 stretch 2 x 60s; Supine R hip FADIR stretch 2 x 60s; Supine R hamstring stretch 2 x 60s; Hooklying R hip frog stretch x 60s; Supine right hip inferior long axis distraction with belt assist by therapist 30 seconds x 2; Supine right hip inferior mobilizations with belt assist and hip flexed at 90 degrees, grade 3, 30 seconds per bout x3 bouts; Supine right hip medial lateral mobilizations with belt assist and hip flexed 45 degrees grade 3, 30 seconds per about x3 bouts; Supine right hip inferior  mobilizations with belt assisted hip in FABER position, grade 3, 30 seconds per bout x2 bouts;   Ther-ex  Treadmill running/walking intervals (4.0/2.0) x 10 minutes with therapist adjusting speed and monitoring for increase in pain. Interval history also obtained;  R figure 4 single leg bridges 2 x 10; Supine RLE SLR 2 x 10, L sidelying R hip clam with manual resistance from therapist 2 x 10; L sidelying R hip straight leg abduction 2 x 10;   Pt educated throughout session about proper posture and technique with exercises. Improved exercise technique, movement at target joints, use of target muscles after min to mod verbal, visual, tactile cues.    Patient demonstrates excellent motivation during session today. Continued right hip manual techniques and stretching during session today.  Utilized belt assist mobilizations to improve range of motion.  Continued right hip strengthening with patient. Initiated running/walking intervals on treadmill to assess tolerance and pain. One of patient's goals is to return to running. She denies increase in pain during entire session. Patient encouraged to follow-up as scheduled.  Patient will benefit from skilled PT services to address deficits in right hip pain and range of motion order improve function at home and with leisure activities.                      PT Short Term Goals - 03/27/21 1419       PT SHORT TERM GOAL #1   Title Pt will be independent with HEP in order to improve strength and decrease hip pain in order to improve pain-free function at home, work, and with leisure activities    Time 4    Period Weeks    Status New    Target Date 04/24/21               PT Long Term Goals - 03/27/21 1420       PT LONG TERM GOAL #1   Title Pt will decrease worst R hip pain as reported on NPRS by at least 3 points in order to demonstrate clinically significant reduction in hip pain.    Baseline 03/27/21: worst: 6/10    Time 8     Period Weeks    Status New    Target Date 05/22/21      PT LONG TERM GOAL #2   Title Pt will increase strength of R hip abduction by at least 1/2 MMT grade in order to demonstrate improvement in strength and function related to pelvic stability    Baseline 03/27/21: R hip abduction: 4-/5    Time 8    Period Weeks    Status New    Target Date 05/22/21      PT LONG TERM GOAL #3   Title Pt will report  at least 50% improvement in R hip symptoms in order to improve her ability to squat, run, and put on her pants and socks    Time 8    Period Weeks    Status New    Target Date 05/22/21      PT LONG TERM GOAL #4   Title Pt will improve R hip internal and external range of motion by at least 10 degrees each in order to improve ability to don pants and socks    Baseline 03/27/21: R hip IR: 15, ER: 35    Time 8    Period Weeks    Status New    Target Date 05/22/21                   Plan - 04/06/21 0934     Clinical Impression Statement Patient demonstrates excellent motivation during session today. Continued right hip manual techniques and stretching during session today.  Utilized belt assist mobilizations to improve range of motion.  Continued right hip strengthening with patient. Initiated running/walking intervals on treadmill to assess tolerance and pain. One of patient's goals is to return to running. She denies increase in pain during entire session. Patient encouraged to follow-up as scheduled.  Patient will benefit from skilled PT services to address deficits in right hip pain and range of motion order improve function at home and with leisure activities.    Personal Factors and Comorbidities Comorbidity 2;Past/Current Experience;Time since onset of injury/illness/exacerbation    Comorbidities anxiety, depression    Examination-Activity Limitations Squat    Examination-Participation Restrictions Community Activity;Other   Running   Stability/Clinical Decision Making  Stable/Uncomplicated    Rehab Potential Good    PT Frequency 2x / week    PT Duration 8 weeks    PT Treatment/Interventions ADLs/Self Care Home Management;Aquatic Therapy;Biofeedback;Canalith Repostioning;Cryotherapy;Electrical Stimulation;Iontophoresis 4mg /ml Dexamethasone;Moist Heat;Traction;Ultrasound;Gait training;Stair training;Functional mobility training;Therapeutic activities;Therapeutic exercise;Balance training;Neuromuscular re-education;Patient/family education;Manual techniques;Passive range of motion;Dry needling;Vestibular;Spinal Manipulations;Joint Manipulations    PT Next Visit Plan Review HEP, progress manual techniques/stretching, progress strengthening    PT Home Exercise Plan Access Code: [email or web address]    Consulted and Agree with Plan of Care Patient              Patient will benefit from skilled therapeutic intervention in order to improve the following deficits and impairments:  Decreased range of motion, Pain  Visit Diagnosis: Pain in right hip  Muscle weakness (generalized)     Problem List Patient Active Problem List   Diagnosis Date Noted   Pain of both hip joints 03/20/2021   Elevated systolic blood pressure reading without diagnosis of hypertension 03/20/2021   03/22/2021 PT, DPT, GCS  Finnbar Cedillos, PT 04/06/2021, 1:56 PM  [facility] [address] [place], [place], [place] Phone: [phone]   Fax:  [phone]  Name: Alexandra Salinas MRN: Ezequiel Ganser Date of Birth: [birth year]

## 2021-04-10 ENCOUNTER — Ambulatory Visit: Payer: BC Managed Care – PPO

## 2021-04-10 ENCOUNTER — Other Ambulatory Visit: Payer: Self-pay

## 2021-04-10 DIAGNOSIS — M25552 Pain in left hip: Secondary | ICD-10-CM | POA: Diagnosis not present

## 2021-04-10 DIAGNOSIS — M6281 Muscle weakness (generalized): Secondary | ICD-10-CM

## 2021-04-10 DIAGNOSIS — M25551 Pain in right hip: Secondary | ICD-10-CM | POA: Diagnosis not present

## 2021-04-10 DIAGNOSIS — M25651 Stiffness of right hip, not elsewhere classified: Secondary | ICD-10-CM | POA: Diagnosis not present

## 2021-04-10 NOTE — Therapy (Signed)
[facility] [address] [place], [place], [address] Phone: [phone]   Fax:  [phone]  Physical Therapy Treatment  Patient Details  Name: Lillieanna Tuohy MRN: [id number] Date of Birth: 04/07/1976 Referring Provider (PT): Dr Joseph Berkshire   Encounter Date: 04/10/2021   PT End of Session - 04/10/21 0938     Visit Number 5    Number of Visits 17    Date for PT Re-Evaluation 05/22/21    Authorization Type eval: 03/27/21    PT Start Time 0933    PT Stop Time 1015    PT Time Calculation (min) 42 min    Activity Tolerance Patient tolerated treatment well    Behavior During Therapy [facility] for tasks assessed/performed              Past Medical History:  Diagnosis Date   Anxiety    Depression    Diverticulitis    GERD (gastroesophageal reflux disease)     Past Surgical History:  Procedure Laterality Date   CESAREAN SECTION  2021   COLONOSCOPY  2020   ESOPHAGOGASTRODUODENOSCOPY  11/2020   REFRACTIVE SURGERY Bilateral 2015    There were no vitals filed for this visit.   Subjective Assessment - 04/10/21 0938     Subjective Pt reports that she is doing well today. Denies any resting hip pain upon arrival. She has been able to work out at the gym including running intervals on the treadmill without any increase in her pain. She has been performing HEP without issue. No specific questions or concerns currently.    Pertinent History Pt has a history of bilateral hip pain which began around 8 years ago. It started in her L hip when she was running while training for a 5K race. The L hip pain resolved and has not bothered her since that initial episode. No L hip pain currently but she has been having some intermittent pain in her R hip. Pain in the R hip is located deep in her R groin. Per patient's report she has had a previous MRI and x-ray [place] showing osteoarthritis. Dr. Ashley Royalty repeated plain film imaging of  her hips which on the right showed no acute fracture or dislocation. There is mild to moderate  arthritic changes of the right hip with joint space narrowing and spurring. Bone spur and osteophyte noted at the right femoral neck. The bones are well mineralized. The soft tissues are unremarkable. She denies any radiation of her pain or BLE numbness/tingling. Dr. Ashley Royalty started her on meloxicam which she has been on for multiple days without any reported adverse side effects. She has had PT in the past which focused primarily on range of motion and worsened her pain. She only attended 3 sessions. Pt also reporting pain with sexual penetration which started prior to having her daughter 3.5 years ago but worsened afterwards and has persisted. She delivered her daughter via cesarean section. Pt denies bowel/bladder incontinence issues. No pain with bowel movements. ROS negative for red flags.    Limitations Other (comment)   Squating   How long can you sit comfortably? unlimited but stiffness in the R hip once getting up    How long can you stand comfortably? unlimited    How long can you walk comfortably? unlimited    Diagnostic tests see history    Patient Stated Goals Return to running, be able to put on pants and socks easier    Currently in Pain?  No/denies                TREATMENT   Manual Therapy  R hip flexor stretch off side of table with therapist blocking pelvis and pt performing posterior pelvic tilt 2 x 60s; Supine R hip figure 4 stretch 2 x 60s; Supine R hip FADIR stretch 2 x 60s; Supine R hamstring stretch 2 x 60s; Supine right hip inferior long axis distraction with belt assist by therapist 30 seconds x 2; Supine right hip inferior mobilizations with belt assist and hip flexed at 90 degrees, grade 3, 30 seconds per bout x2 bouts; Supine right hip medial lateral mobilizations with belt assist and hip flexed 45 degrees grade 3, 30 seconds per about x2 bouts; Supine right hip  inferior mobilizations with belt assisted hip in FABER position, grade 3, 30 seconds per bout x2 bouts;   Ther-ex  NuStep L2-3 x 6 minutes for warm-up during history; R figure 4 single leg bridges x 10; Total Gym Level 22 R single leg squats 2 x 10; TRX curtsy lunges 2 x 10; BOSU (round side up) squats without UE support x 10; HEP updated and handout provided to patient;   Pt educated throughout session about proper posture and technique with exercises. Improved exercise technique, movement at target joints, use of target muscles after min to mod verbal, visual, tactile cues.    Patient demonstrates excellent motivation during session today. Continued right hip manual techniques and stretching during session today.  Utilized belt assist mobilizations to improve range of motion.  Continued right hip strengthening with patient. Pt reports that she continues to experience pelvic pain during penetration with intercourse and would be interested in a screen by pelvic therapist. Will arrange a treatment visit with pelvic therapist at this clinic. Patient encouraged to follow-up as scheduled.  She will benefit from skilled PT services to address deficits in right hip pain and range of motion order improve function at home and with leisure activities.                      PT Short Term Goals - 03/27/21 1419       PT SHORT TERM GOAL #1   Title Pt will be independent with HEP in order to improve strength and decrease hip pain in order to improve pain-free function at home, work, and with leisure activities    Time 4    Period Weeks    Status New    Target Date 04/24/21               PT Long Term Goals - 03/27/21 1420       PT LONG TERM GOAL #1   Title Pt will decrease worst R hip pain as reported on NPRS by at least 3 points in order to demonstrate clinically significant reduction in hip pain.    Baseline 03/27/21: worst: 6/10    Time 8    Period Weeks    Status New     Target Date 05/22/21      PT LONG TERM GOAL #2   Title Pt will increase strength of R hip abduction by at least 1/2 MMT grade in order to demonstrate improvement in strength and function related to pelvic stability    Baseline 03/27/21: R hip abduction: 4-/5    Time 8    Period Weeks    Status New    Target Date 05/22/21      PT LONG TERM GOAL #  3   Title Pt will report at least 50% improvement in R hip symptoms in order to improve her ability to squat, run, and put on her pants and socks    Time 8    Period Weeks    Status New    Target Date 05/22/21      PT LONG TERM GOAL #4   Title Pt will improve R hip internal and external range of motion by at least 10 degrees each in order to improve ability to don pants and socks    Baseline 03/27/21: R hip IR: 15, ER: 35    Time 8    Period Weeks    Status New    Target Date 05/22/21                   Plan - 04/10/21 9528     Clinical Impression Statement Patient demonstrates excellent motivation during session today. Continued right hip manual techniques and stretching during session today.  Utilized belt assist mobilizations to improve range of motion.  Continued right hip strengthening with patient. Pt reports that she continues to experience pelvic pain during penetration with intercourse and would be interested in a screen by pelvic therapist. Will arrange a treatment visit with pelvic therapist at this clinic. Patient encouraged to follow-up as scheduled.  She will benefit from skilled PT services to address deficits in right hip pain and range of motion order improve function at home and with leisure activities.    Personal Factors and Comorbidities Comorbidity 2;Past/Current Experience;Time since onset of injury/illness/exacerbation    Comorbidities anxiety, depression    Examination-Activity Limitations Squat    Examination-Participation Restrictions Community Activity;Other   Running   Stability/Clinical Decision Making  Stable/Uncomplicated    Rehab Potential Good    PT Frequency 2x / week    PT Duration 8 weeks    PT Treatment/Interventions ADLs/Self Care Home Management;Aquatic Therapy;Biofeedback;Canalith Repostioning;Cryotherapy;Electrical Stimulation;Iontophoresis 4mg /ml Dexamethasone;Moist Heat;Traction;Ultrasound;Gait training;Stair training;Functional mobility training;Therapeutic activities;Therapeutic exercise;Balance training;Neuromuscular re-education;Patient/family education;Manual techniques;Passive range of motion;Dry needling;Vestibular;Spinal Manipulations;Joint Manipulations    PT Next Visit Plan Review HEP, progress manual techniques/stretching, progress strengthening    PT Home Exercise Plan Access Code: [email or web address]    Consulted and Agree with Plan of Care Patient              Patient will benefit from skilled therapeutic intervention in order to improve the following deficits and impairments:  Decreased range of motion, Pain  Visit Diagnosis: Pain in right hip  Muscle weakness (generalized)     Problem List Patient Active Problem List   Diagnosis Date Noted   Pain of both hip joints 03/20/2021   Elevated systolic blood pressure reading without diagnosis of hypertension 03/20/2021   03/22/2021 PT, DPT, GCS  Brian Zeitlin, PT 04/10/2021, 1:35 PM  [facility] [address] [place], [place], [place] Phone: [phone]   Fax:  [phone]  Name: Jovana Rembold MRN: Ezequiel Ganser Date of Birth: 1975/08/11

## 2021-04-17 ENCOUNTER — Other Ambulatory Visit: Payer: Self-pay

## 2021-04-17 ENCOUNTER — Ambulatory Visit: Payer: BC Managed Care – PPO

## 2021-04-17 DIAGNOSIS — M25552 Pain in left hip: Secondary | ICD-10-CM | POA: Diagnosis not present

## 2021-04-17 DIAGNOSIS — M25551 Pain in right hip: Secondary | ICD-10-CM | POA: Diagnosis not present

## 2021-04-17 DIAGNOSIS — M6281 Muscle weakness (generalized): Secondary | ICD-10-CM | POA: Diagnosis not present

## 2021-04-17 DIAGNOSIS — M25651 Stiffness of right hip, not elsewhere classified: Secondary | ICD-10-CM

## 2021-04-17 NOTE — Therapy (Signed)
[facility] [address] [place], [place], [address] Phone: [phone]   Fax:  [phone]  Physical Therapy Treatment  Patient Details  Name: Alexandra Salinas MRN: [id number] Date of Birth: Dec 25, 1975 Referring Provider (PT): Dr Joseph Berkshire   Encounter Date: 04/17/2021   PT End of Session - 04/17/21 1124     Visit Number 6    Number of Visits 17    Date for PT Re-Evaluation 05/22/21    Authorization Type eval: 03/27/21    PT Start Time 0931    PT Stop Time 1015    PT Time Calculation (min) 44 min    Activity Tolerance Patient tolerated treatment well    Behavior During Therapy [facility] for tasks assessed/performed              Past Medical History:  Diagnosis Date   Anxiety    Depression    Diverticulitis    GERD (gastroesophageal reflux disease)     Past Surgical History:  Procedure Laterality Date   CESAREAN SECTION  2021   COLONOSCOPY  2020   ESOPHAGOGASTRODUODENOSCOPY  11/2020   REFRACTIVE SURGERY Bilateral 2015    There were no vitals filed for this visit.   Subjective Assessment - 04/17/21 0933     Subjective Pt reports that she is doing alright today. Denies any resting hip pain upon arrival. She has been doing lunges and squats and has experienced multiple episodes where she felt a sudden weakness in her right hip not associated with pain. She has been performing HEP without issue.    Pertinent History Pt has a history of bilateral hip pain which began around 8 years ago. It started in her L hip when she was running while training for a 5K race. The L hip pain resolved and has not bothered her since that initial episode. No L hip pain currently but she has been having some intermittent pain in her R hip. Pain in the R hip is located deep in her R groin. Per patient's report she has had a previous MRI and [place]-ray [place] showing osteoarthritis. Dr. Ashley Royalty repeated plain film imaging of her hips  which on the right showed no acute fracture or dislocation. There is mild to moderate  arthritic changes of the right hip with joint space narrowing and spurring. Bone spur and osteophyte noted at the right femoral neck. The bones are well mineralized. The soft tissues are unremarkable. She denies any radiation of her pain or BLE numbness/tingling. Dr. Ashley Royalty started her on meloxicam which she has been on for multiple days without any reported adverse side effects. She has had PT in the past which focused primarily on range of motion and worsened her pain. She only attended 3 sessions. Pt also reporting pain with sexual penetration which started prior to having her daughter 3.5 years ago but worsened afterwards and has persisted. She delivered her daughter via cesarean section. Pt denies bowel/bladder incontinence issues. No pain with bowel movements. ROS negative for red flags.    Limitations Other (comment)   Squating   How long can you sit comfortably? unlimited but stiffness in the R hip once getting up    How long can you stand comfortably? unlimited    How long can you walk comfortably? unlimited    Diagnostic tests see history    Patient Stated Goals Return to running, be able to put on pants and socks easier    Currently in Pain? No/denies  TREATMENT   Manual Therapy  R hip flexor stretch off side of table with therapist blocking pelvis 2 x 45s; Supine R hip figure 4 stretch 2 x 60s; Supine R hip FADIR stretch 2 x 60s; Supine right hip inferior long axis distraction with belt assist by therapist 30 seconds x 2; Supine right hip inferior mobilizations with belt assist and hip flexed at 90 degrees, grade 3, 30 seconds per bout x2 bouts; Supine right hip medial lateral mobilizations with belt assist and hip flexed 45 degrees grade 3, 30 seconds per about x2 bouts; Supine right hip inferior mobilizations with belt assisted hip in FABER position, grade 3, 30 seconds per  bout x2 bouts; Iliopsoas trigger point release near insertion; Prone posterior to anterior mobilizations femur on pelvis, grade III, 30s/bout x 2 bouts;   Ther-ex  Treadmill running/walking intervals (4.0/2.0) x 6 minutes with therapist adjusting speed and monitoring for increase in pain. History obtained;  R figure 4 single leg bridges x 10; Hooklying clams with manual resistance from therapist 2 x 10; Hooklying adductor squeeze with manual resistance from therapist 2 x 10; L sidelying R straight leg hip abduction 2 x 10; Front planks with alternating hip extension 2 x 5; R side knee planks with L straight leg abduction 2 x 10;   Pt educated throughout session about proper posture and technique with exercises. Improved exercise technique, movement at target joints, use of target muscles after min to mod verbal, visual, tactile cues.    Patient demonstrates excellent motivation during session today. Continued right hip manual techniques and stretching during session today.  Utilized belt assist mobilizations to improve range of motion and for pain modulation. Continued right hip strengthening with patient. Added front and side planks into exercises this session. Pt reports that she continues to experience pelvic pain during penetration with intercourse and would be interested in a screen by pelvic therapist. Will arrange a treatment visit with pelvic therapist at this clinic. Patient encouraged to follow-up as scheduled.  She will benefit from skilled PT services to address deficits in right hip pain and range of motion order improve function at home and with leisure activities.                      PT Short Term Goals - 03/27/21 1419       PT SHORT TERM GOAL #1   Title Pt will be independent with HEP in order to improve strength and decrease hip pain in order to improve pain-free function at home, work, and with leisure activities    Time 4    Period Weeks    Status New     Target Date 04/24/21               PT Long Term Goals - 03/27/21 1420       PT LONG TERM GOAL #1   Title Pt will decrease worst R hip pain as reported on NPRS by at least 3 points in order to demonstrate clinically significant reduction in hip pain.    Baseline 03/27/21: worst: 6/10    Time 8    Period Weeks    Status New    Target Date 05/22/21      PT LONG TERM GOAL #2   Title Pt will increase strength of R hip abduction by at least 1/2 MMT grade in order to demonstrate improvement in strength and function related to pelvic stability    Baseline 03/27/21: R hip  abduction: 4-/5    Time 8    Period Weeks    Status New    Target Date 05/22/21      PT LONG TERM GOAL #3   Title Pt will report at least 50% improvement in R hip symptoms in order to improve her ability to squat, run, and put on her pants and socks    Time 8    Period Weeks    Status New    Target Date 05/22/21      PT LONG TERM GOAL #4   Title Pt will improve R hip internal and external range of motion by at least 10 degrees each in order to improve ability to don pants and socks    Baseline 03/27/21: R hip IR: 15, ER: 35    Time 8    Period Weeks    Status New    Target Date 05/22/21                   Plan - 04/17/21 1125     Clinical Impression Statement Patient demonstrates excellent motivation during session today. Continued right hip manual techniques and stretching during session today.  Utilized belt assist mobilizations to improve range of motion and for pain modulation. Continued right hip strengthening with patient. Added front and side planks into exercises this session. Pt reports that she continues to experience pelvic pain during penetration with intercourse and would be interested in a screen by pelvic therapist. Will arrange a treatment visit with pelvic therapist at this clinic. Patient encouraged to follow-up as scheduled.  She will benefit from skilled PT services to address  deficits in right hip pain and range of motion order improve function at home and with leisure activities.    Personal Factors and Comorbidities Comorbidity 2;Past/Current Experience;Time since onset of injury/illness/exacerbation    Comorbidities anxiety, depression    Examination-Activity Limitations Squat    Examination-Participation Restrictions Community Activity;Other   Running   Stability/Clinical Decision Making Stable/Uncomplicated    Rehab Potential Good    PT Frequency 2x / week    PT Duration 8 weeks    PT Treatment/Interventions ADLs/Self Care Home Management;Aquatic Therapy;Biofeedback;Canalith Repostioning;Cryotherapy;Electrical Stimulation;Iontophoresis 4mg /ml Dexamethasone;Moist Heat;Traction;Ultrasound;Gait training;Stair training;Functional mobility training;Therapeutic activities;Therapeutic exercise;Balance training;Neuromuscular re-education;Patient/family education;Manual techniques;Passive range of motion;Dry needling;Vestibular;Spinal Manipulations;Joint Manipulations    PT Next Visit Plan Review HEP, progress manual techniques/stretching, progress strengthening, consult with pelvic health therapist    PT Home Exercise Plan Access Code: [email or web address]    Consulted and Agree with Plan of Care Patient              Patient will benefit from skilled therapeutic intervention in order to improve the following deficits and impairments:  Decreased range of motion, Pain  Visit Diagnosis: Pain in right hip  Muscle weakness (generalized)  Stiffness of right hip, not elsewhere classified     Problem List Patient Active Problem List   Diagnosis Date Noted   Pain of both hip joints 03/20/2021   Elevated systolic blood pressure reading without diagnosis of hypertension 03/20/2021   03/22/2021 PT, DPT, GCS  Pleas Carneal, PT 04/17/2021, 11:34 AM  [facility] [address]. [place], [place], [place] Phone:  [phone]   Fax:  [phone]  Name: Alexandra Salinas MRN: Ezequiel Ganser Date of Birth: [birth year]

## 2021-04-18 ENCOUNTER — Ambulatory Visit: Payer: BC Managed Care – PPO | Admitting: Physical Therapy

## 2021-04-19 ENCOUNTER — Other Ambulatory Visit: Payer: Self-pay

## 2021-04-19 ENCOUNTER — Ambulatory Visit: Payer: BC Managed Care – PPO

## 2021-04-19 DIAGNOSIS — M6281 Muscle weakness (generalized): Secondary | ICD-10-CM | POA: Diagnosis not present

## 2021-04-19 DIAGNOSIS — M25651 Stiffness of right hip, not elsewhere classified: Secondary | ICD-10-CM | POA: Diagnosis not present

## 2021-04-19 DIAGNOSIS — M25551 Pain in right hip: Secondary | ICD-10-CM | POA: Diagnosis not present

## 2021-04-19 DIAGNOSIS — M25552 Pain in left hip: Secondary | ICD-10-CM | POA: Diagnosis not present

## 2021-04-19 NOTE — Therapy (Signed)
[facility] [address] [place], [place], [address] Phone: [phone]   Fax:  [phone]  Physical Therapy Treatment/Goal Update  Patient Details  Name: Alexandra Salinas MRN: [id number] Date of Birth: 01/15/1976 Referring Provider (PT): Dr Rosette Reveal   Encounter Date: 04/19/2021   PT End of Session - 04/19/21 0943     Visit Number 7    Number of Visits 17    Date for PT Re-Evaluation 05/22/21    Authorization Type eval: 03/27/21    PT Start Time 0931    PT Stop Time 1015    PT Time Calculation (min) 44 min    Activity Tolerance Patient tolerated treatment well    Behavior During Therapy [facility] for tasks assessed/performed               Past Medical History:  Diagnosis Date   Anxiety    Depression    Diverticulitis    GERD (gastroesophageal reflux disease)     Past Surgical History:  Procedure Laterality Date   CESAREAN SECTION  2021   COLONOSCOPY  2020   ESOPHAGOGASTRODUODENOSCOPY  11/2020   REFRACTIVE SURGERY Bilateral 2015    There were no vitals filed for this visit.   Subjective Assessment - 04/19/21 0937     Subjective Pt reports that she is doing alright today. Denies any resting hip pain upon arrival. States that she utilized deep breathing techniques last night when waking up and was able to go back to sleep without going to the bathroom. She has been performing HEP without issue.    Pertinent History Pt has a history of bilateral hip pain which began around 8 years ago. It started in her L hip when she was running while training for a 5K race. The L hip pain resolved and has not bothered her since that initial episode. No L hip pain currently but she has been having some intermittent pain in her R hip. Pain in the R hip is located deep in her R groin. Per patient's report she has had a previous MRI and x-ray [place] showing osteoarthritis. Dr. Zigmund Daniel repeated plain film imaging of her hips  which on the right showed no acute fracture or dislocation. There is mild to moderate  arthritic changes of the right hip with joint space narrowing and spurring. Bone spur and osteophyte noted at the right femoral neck. The bones are well mineralized. The soft tissues are unremarkable. She denies any radiation of her pain or BLE numbness/tingling. Dr. Zigmund Daniel started her on meloxicam which she has been on for multiple days without any reported adverse side effects. She has had PT in the past which focused primarily on range of motion and worsened her pain. She only attended 3 sessions. Pt also reporting pain with sexual penetration which started prior to having her daughter 3.5 years ago but worsened afterwards and has persisted. She delivered her daughter via cesarean section. Pt denies bowel/bladder incontinence issues. No pain with bowel movements. ROS negative for red flags.    Limitations Other (comment)   Squating   How long can you sit comfortably? unlimited but stiffness in the R hip once getting up    How long can you stand comfortably? unlimited    How long can you walk comfortably? unlimited    Diagnostic tests see history    Patient Stated Goals Return to running, be able to put on pants and socks easier    Currently in Pain? No/denies  TREATMENT   Manual Therapy  R hip flexor stretch off side of table with therapist blocking pelvis 2 x 45s; Supine R hip figure 4 stretch 2 x 60s; Supine R hip FADIR stretch 2 x 60s; Supine right hip inferior long axis distraction with belt assist by therapist 30 seconds x 2; Supine right hip inferior mobilizations with belt assist and hip flexed at 90 degrees, grade 3, 30 seconds per bout x2 bouts; Supine right hip medial lateral mobilizations with belt assist and hip flexed 45 degrees grade 3, 30 seconds per about x2 bouts; Supine right hip inferior mobilizations with belt assisted hip in FABER position, grade 3, 30 seconds per  bout x2 bouts;   Ther-ex  Treadmill running/walking intervals (4.0/2.0) x 6 minutes with therapist adjusting speed and monitoring for increase in pain. History obtained;  Updated outcome measures and goals with patient: Worst pain: 4/10; R hip abduction: 4/5; % improvement: Pt unsure; R hip ROM: IR: 25, ER: 35   Pt educated throughout session about proper posture and technique with exercises. Improved exercise technique, movement at target joints, use of target muscles after min to mod verbal, visual, tactile cues.    Patient demonstrates excellent motivation during session today. Continued right hip manual techniques and stretching during session today.  Utilized belt assist mobilizations to improve range of motion and for pain modulation. Updated outcome measures with patient during session. Her worst pain has decreased from 6/10 initially to 4/10 today. Her R hip IR has improved by 10 degrees. She is unsure if she subjectively notices any improvement in her hip. Patient encouraged to follow-up as scheduled.  She will benefit from skilled PT services to address deficits in right hip pain and range of motion order improve function at home and with leisure activities.                      PT Short Term Goals - 03/27/21 1419       PT SHORT TERM GOAL #1   Title Pt will be independent with HEP in order to improve strength and decrease hip pain in order to improve pain-free function at home, work, and with leisure activities    Time 4    Period Weeks    Status New    Target Date 04/24/21               PT Long Term Goals - 04/19/21 1005       PT LONG TERM GOAL #1   Title Pt will decrease worst R hip pain as reported on NPRS by at least 3 points in order to demonstrate clinically significant reduction in hip pain.    Baseline 03/27/21: worst: 6/10; 04/19/21: 4    Time 8    Period Weeks    Status Partially Met    Target Date 05/22/21      PT LONG TERM GOAL #2    Title Pt will increase strength of R hip abduction by at least 1/2 MMT grade in order to demonstrate improvement in strength and function related to pelvic stability    Baseline 03/27/21: R hip abduction: 4-/5; 04/19/21: 4/5;    Time 8    Period Weeks    Status Achieved      PT LONG TERM GOAL #3   Title Pt will report at least 50% improvement in R hip symptoms in order to improve her ability to squat, run, and put on her pants and socks  Baseline 04/19/21: pt unsure, unable to rate    Time 8    Period Weeks    Status On-going    Target Date 05/22/21      PT LONG TERM GOAL #4   Title Pt will improve R hip internal and external range of motion by at least 10 degrees each in order to improve ability to don pants and socks    Baseline 03/27/21: R hip IR: 15, ER: 35; 04/19/21:  R hip IR: 25, ER: 35    Time 8    Period Weeks    Status Partially Met    Target Date 05/22/21                   Plan - 04/19/21 0943     Clinical Impression Statement Patient demonstrates excellent motivation during session today. Continued right hip manual techniques and stretching during session today.  Utilized belt assist mobilizations to improve range of motion and for pain modulation. Updated outcome measures with patient during session. Her worst pain has decreased from 6/10 initially to 4/10 today. Her R hip IR has improved by 10 degrees. She is unsure if she subjectively notices any improvement in her hip. Patient encouraged to follow-up as scheduled.  She will benefit from skilled PT services to address deficits in right hip pain and range of motion order improve function at home and with leisure activities.    Personal Factors and Comorbidities Comorbidity 2;Past/Current Experience;Time since onset of injury/illness/exacerbation    Comorbidities anxiety, depression    Examination-Activity Limitations Squat    Examination-Participation Restrictions Community Activity;Other   Running    Stability/Clinical Decision Making Stable/Uncomplicated    Rehab Potential Good    PT Frequency 2x / week    PT Duration 8 weeks    PT Treatment/Interventions ADLs/Self Care Home Management;Aquatic Therapy;Biofeedback;Canalith Repostioning;Cryotherapy;Electrical Stimulation;Iontophoresis 34m/ml Dexamethasone;Moist Heat;Traction;Ultrasound;Gait training;Stair training;Functional mobility training;Therapeutic activities;Therapeutic exercise;Balance training;Neuromuscular re-education;Patient/family education;Manual techniques;Passive range of motion;Dry needling;Vestibular;Spinal Manipulations;Joint Manipulations    PT Next Visit Plan Review HEP, progress manual techniques/stretching, progress strengthening, consult with pelvic health therapist    PT Home Exercise Plan Access Code: [email or web address]   Consulted and Agree with Plan of Care Patient               Patient will benefit from skilled therapeutic intervention in order to improve the following deficits and impairments:  Decreased range of motion, Pain  Visit Diagnosis: Pain in right hip  Muscle weakness (generalized)     Problem List Patient Active Problem List   Diagnosis Date Noted   Pain of both hip joints 03/20/2021   Elevated systolic blood pressure reading without diagnosis of hypertension 03/20/2021   JPhillips GroutPT, DPT, GCS  Issak Goley, PT 04/19/2021, 11:27 AM  [place] [facility] [place] [address]Phone: [phone]  Fax:  [phone] Name: Alexandra KissMRN: [id number] of Birth: 8April 03, 1977

## 2021-04-20 ENCOUNTER — Encounter: Payer: Self-pay | Admitting: Radiology

## 2021-04-24 ENCOUNTER — Ambulatory Visit: Payer: BC Managed Care – PPO | Attending: Family Medicine | Admitting: Physical Therapy

## 2021-04-24 ENCOUNTER — Other Ambulatory Visit: Payer: Self-pay

## 2021-04-24 DIAGNOSIS — M25651 Stiffness of right hip, not elsewhere classified: Secondary | ICD-10-CM | POA: Diagnosis not present

## 2021-04-24 DIAGNOSIS — M25551 Pain in right hip: Secondary | ICD-10-CM | POA: Insufficient documentation

## 2021-04-24 DIAGNOSIS — M6281 Muscle weakness (generalized): Secondary | ICD-10-CM | POA: Diagnosis not present

## 2021-04-24 NOTE — Therapy (Signed)
[place] [facility] [address] [place], [place], [address] Phone: [phone]   Fax:  [phone]  Physical Therapy Treatment  Patient Details  Name: Alexandra Salinas MRN: [id number] Date of Birth: [birth year] Referring Provider (PT): Dr Rosette Reveal   Encounter Date: 04/24/2021   PT End of Session - 04/24/21 1039     Visit Number 8    Number of Visits 17    Date for PT Re-Evaluation 05/22/21    Authorization Type eval: 03/27/21    PT Start Time 0932    PT Stop Time 1018    PT Time Calculation (min) 46 min    Activity Tolerance Patient tolerated treatment well    Behavior During Therapy [facility] for tasks assessed/performed             Past Medical History:  Diagnosis Date   Anxiety    Depression    Diverticulitis    GERD (gastroesophageal reflux disease)     Past Surgical History:  Procedure Laterality Date   CESAREAN SECTION  2021   COLONOSCOPY  2020   ESOPHAGOGASTRODUODENOSCOPY  11/2020   REFRACTIVE SURGERY Bilateral 2015    There were no vitals filed for this visit.   Subjective Assessment - 04/24/21 0934     Subjective Pt states she is doing well today. Denies pain at rest. States that she has been compliant with HEP however does not like some of the execises due to her limited ROM within hip (pt also acknowldged primary PT Corene Cornea encouraged her to perform those exercises for that reason).    Pertinent History Pt has a history of bilateral hip pain which began around 8 years ago. It started in her L hip when she was running while training for a 5K race. The L hip pain resolved and has not bothered her since that initial episode. No L hip pain currently but she has been having some intermittent pain in her R hip. Pain in the R hip is located deep in her R groin. Per patient's report she has had a previous MRI and [place]-ray [place] showing osteoarthritis. Dr. Zigmund Daniel repeated plain film imaging of her hips which on  the right showed no acute fracture or dislocation. There is mild to moderate  arthritic changes of the right hip with joint space narrowing and spurring. Bone spur and osteophyte noted at the right femoral neck. The bones are well mineralized. The soft tissues are unremarkable. She denies any radiation of her pain or BLE numbness/tingling. Dr. Zigmund Daniel started her on meloxicam which she has been on for multiple days without any reported adverse side effects. She has had PT in the past which focused primarily on range of motion and worsened her pain. She only attended 3 sessions. Pt also reporting pain with sexual penetration which started prior to having her daughter 3.5 years ago but worsened afterwards and has persisted. She delivered her daughter via cesarean section. Pt denies bowel/bladder incontinence issues. No pain with bowel movements. ROS negative for red flags.    Limitations Other (comment)   Squating   How long can you sit comfortably? unlimited but stiffness in the R hip once getting up    How long can you stand comfortably? unlimited    How long can you walk comfortably? unlimited    Diagnostic tests see history    Patient Stated Goals Return to running, be able to put on pants and socks easier    Currently in Pain? No/denies  Pain Onset More than a month ago             TREATMENT     Manual Therapy  R hip flexor stretch off side of table with therapist blocking pelvis 2 x 45s; Supine R hip figure 4 stretch 2 x 60s; Supine R hip FADIR stretch 2 x 60s; Supine right hip inferior long axis distraction with belt assist by therapist 30 seconds x 2; Supine right hip inferior mobilizations with belt assist and hip flexed at 90 degrees, grade 3, 30 seconds per bout x2 bouts; Supine right hip medial lateral mobilizations with belt assist and hip flexed 45 degrees grade 3, 30 seconds per about x2 bouts; Supine right hip inferior mobilizations with belt assisted hip in FABER position,  grade 3, 30 seconds per bout x2 bouts;     Ther-ex  Treadmill running/walking intervals (2.5/4.5 mph) x 6 minutes with therapist adjusting speed and monitoring for increase in pain.  R figure 4 single leg bridges 2 x 10; Hooklying clams with BRB and pause at end range 2 x 10; Hooklying adductor squeeze with rainbow ball and pause at end range 2 x 10; R side knee planks with L straight leg abduction 2 x 10;    Pt educated throughout session about proper posture and technique with exercises. Improved exercise technique, movement at target joints, use of target muscles after min to mod verbal, visual, tactile cues.      Patient demonstrates excellent motivation during session today. Continued current POC including manual techniques, mobilizations utilizing the belt and stretching. During stretches, FADIR ROM presented as the most limited. Treadmill intervals were progressed with slight increase in speed; improved running gait mechanics noted with no pain reported. Completed session with focal strengthening of the R hip. She will benefit from skilled PT services to address deficits in right hip pain and range of motion order improve function at home and with leisure activities.                        PT Short Term Goals - 03/27/21 1419       PT SHORT TERM GOAL #1   Title Pt will be independent with HEP in order to improve strength and decrease hip pain in order to improve pain-free function at home, work, and with leisure activities    Time 4    Period Weeks    Status New    Target Date 04/24/21               PT Long Term Goals - 04/19/21 1005       PT LONG TERM GOAL #1   Title Pt will decrease worst R hip pain as reported on NPRS by at least 3 points in order to demonstrate clinically significant reduction in hip pain.    Baseline 03/27/21: worst: 6/10; 04/19/21: 4    Time 8    Period Weeks    Status Partially Met    Target Date 05/22/21      PT LONG TERM  GOAL #2   Title Pt will increase strength of R hip abduction by at least 1/2 MMT grade in order to demonstrate improvement in strength and function related to pelvic stability    Baseline 03/27/21: R hip abduction: 4-/5; 04/19/21: 4/5;    Time 8    Period Weeks    Status Achieved      PT LONG TERM GOAL #3   Title Pt will report  at least 50% improvement in R hip symptoms in order to improve her ability to squat, run, and put on her pants and socks    Baseline 04/19/21: pt unsure, unable to rate    Time 8    Period Weeks    Status On-going    Target Date 05/22/21      PT LONG TERM GOAL #4   Title Pt will improve R hip internal and external range of motion by at least 10 degrees each in order to improve ability to don pants and socks    Baseline 03/27/21: R hip IR: 15, ER: 35; 04/19/21:  R hip IR: 25, ER: 35    Time 8    Period Weeks    Status Partially Met    Target Date 05/22/21                   Plan - 04/24/21 1039     Clinical Impression Statement Patient demonstrates excellent motivation during session today. Continued current POC including manual techniques, mobilizations utilizing the belt and stretching. During stretches, FADIR ROM presented as the most limited. Treadmill intervals were progressed with slight increase in speed; improved running gait mechanics noted with no pain reported. Completed session with focal strengthening of the R hip. She will benefit from skilled PT services to address deficits in right hip pain and range of motion order improve function at home and with leisure activities.    Personal Factors and Comorbidities Comorbidity 2;Past/Current Experience;Time since onset of injury/illness/exacerbation    Comorbidities anxiety, depression    Examination-Activity Limitations Squat    Examination-Participation Restrictions Community Activity;Other   Running   Stability/Clinical Decision Making Stable/Uncomplicated    Rehab Potential Good    PT  Frequency 2x / week    PT Duration 8 weeks    PT Treatment/Interventions ADLs/Self Care Home Management;Aquatic Therapy;Biofeedback;Canalith Repostioning;Cryotherapy;Electrical Stimulation;Iontophoresis 29m/ml Dexamethasone;Moist Heat;Traction;Ultrasound;Gait training;Stair training;Functional mobility training;Therapeutic activities;Therapeutic exercise;Balance training;Neuromuscular re-education;Patient/family education;Manual techniques;Passive range of motion;Dry needling;Vestibular;Spinal Manipulations;Joint Manipulations    PT Next Visit Plan Review HEP, progress manual techniques/stretching, progress strengthening, consult with pelvic health therapist    PT Home Exercise Plan Access Code: [email or web address]   Consulted and Agree with Plan of Care Patient             Patient will benefit from skilled therapeutic intervention in order to improve the following deficits and impairments:  Decreased range of motion, Pain  Visit Diagnosis: Pain in right hip  Muscle weakness (generalized)  Stiffness of right hip, not elsewhere classified     Problem List Patient Active Problem List   Diagnosis Date Noted   Pain of both hip joints 03/20/2021   Elevated systolic blood pressure reading without diagnosis of hypertension 03/20/2021    KPatrina LeveringPT, DPT  [facility]MSeldovia N[place]aska [address]Phone: [phone]  Fax:  [phone] Name: EArmani GawlikMRN: [id number] of Birth: [birth year]

## 2021-04-25 ENCOUNTER — Encounter: Payer: BC Managed Care – PPO | Admitting: Family Medicine

## 2021-04-26 ENCOUNTER — Other Ambulatory Visit: Payer: Self-pay

## 2021-04-26 ENCOUNTER — Ambulatory Visit: Payer: BC Managed Care – PPO

## 2021-04-26 DIAGNOSIS — M25651 Stiffness of right hip, not elsewhere classified: Secondary | ICD-10-CM | POA: Diagnosis not present

## 2021-04-26 DIAGNOSIS — M6281 Muscle weakness (generalized): Secondary | ICD-10-CM | POA: Diagnosis not present

## 2021-04-26 DIAGNOSIS — M25551 Pain in right hip: Secondary | ICD-10-CM

## 2021-04-26 NOTE — Therapy (Signed)
[place] [facility] [address] [place], [place], [address] Phone: [phone]   Fax:  [phone]  Physical Therapy Treatment  Patient Details  Name: Alexandra Salinas MRN: [id number] Date of Birth: [birth year] Referring Provider (PT): Dr Rosette Reveal   Encounter Date: 04/26/2021   PT End of Session - 04/26/21 0942     Visit Number 9    Number of Visits 17    Date for PT Re-Evaluation 05/22/21    Authorization Type eval: 03/27/21    PT Start Time 0934    PT Stop Time 1015    PT Time Calculation (min) 41 min    Activity Tolerance Patient tolerated treatment well    Behavior During Therapy [facility] for tasks assessed/performed              Past Medical History:  Diagnosis Date   Anxiety    Depression    Diverticulitis    GERD (gastroesophageal reflux disease)     Past Surgical History:  Procedure Laterality Date   CESAREAN SECTION  2021   COLONOSCOPY  2020   ESOPHAGOGASTRODUODENOSCOPY  11/2020   REFRACTIVE SURGERY Bilateral 2015    There were no vitals filed for this visit.   Subjective Assessment - 04/26/21 0936     Subjective Pt states she is doing well today. Denies pain at rest. States that she has been compliant with HEP. She has not tried running outside. Denies any pain with squats recently at the gym.  No specific questions.    Pertinent History Pt has a history of bilateral hip pain which began around 8 years ago. It started in her L hip when she was running while training for a 5K race. The L hip pain resolved and has not bothered her since that initial episode. No L hip pain currently but she has been having some intermittent pain in her R hip. Pain in the R hip is located deep in her R groin. Per patient's report she has had a previous MRI and x-ray [place] showing osteoarthritis. Dr. Zigmund Daniel repeated plain film imaging of her hips which on the right showed no acute fracture or dislocation. There is mild  to moderate  arthritic changes of the right hip with joint space narrowing and spurring. Bone spur and osteophyte noted at the right femoral neck. The bones are well mineralized. The soft tissues are unremarkable. She denies any radiation of her pain or BLE numbness/tingling. Dr. Zigmund Daniel started her on meloxicam which she has been on for multiple days without any reported adverse side effects. She has had PT in the past which focused primarily on range of motion and worsened her pain. She only attended 3 sessions. Pt also reporting pain with sexual penetration which started prior to having her daughter 3.5 years ago but worsened afterwards and has persisted. She delivered her daughter via cesarean section. Pt denies bowel/bladder incontinence issues. No pain with bowel movements. ROS negative for red flags.    Limitations Other (comment)   Squating   How long can you sit comfortably? unlimited but stiffness in the R hip once getting up    How long can you stand comfortably? unlimited    How long can you walk comfortably? unlimited    Diagnostic tests see history    Patient Stated Goals Return to running, be able to put on pants and socks easier    Currently in Pain? No/denies    Pain Onset --  TREATMENT   Manual Therapy  R hip flexor stretch off side of table with therapist blocking pelvis 2 x 45s; Supine R hip figure 4 stretch x 60s; Supine R hip FADIR stretch x 60s; Supine right hip inferior long axis distraction with belt assist by therapist 30 seconds x 2; Supine right hip inferior mobilizations with belt assist and hip flexed at 90 degrees, grade 3, 30 seconds per bout x2 bouts; Supine right hip medial lateral mobilizations with belt assist and hip flexed 45 degrees grade 3, 30 seconds per about x2 bouts; Supine right hip inferior mobilizations with belt assisted hip in FABER position, grade 3, 30 seconds per bout x2 bouts;   Ther-ex  Treadmill running/walking  intervals (4.2/2.0) x 7 minutes with therapist adjusting speed and monitoring for increase in pain. History obtained;  Hooklying clams with manual resistance from therapist 2 x 10; Hooklying adductor squeeze with manual resistance from therapist 2 x 10; Supine R SLR with 2# ankle weight x 10; L sidelying R straight leg hip abduction with 2# ankle weight 2 x 10; TRX curtsy lunges x 10 BLE; Resisted side stepping with blue tband around ankles 20' x 2;   Pt educated throughout session about proper posture and technique with exercises. Improved exercise technique, movement at target joints, use of target muscles after min to mod verbal, visual, tactile cues.    Patient demonstrates excellent motivation during session today. Continued right hip manual techniques and stretching during session today.  Utilized belt assist mobilizations to improve range of motion and for pain modulation. Continued right hip strengthening with patient.  Patient encouraged to attempt running intervals outdoors to see how her right hip tolerates.  She will need a progress note at the next therapy session. Patient encouraged to follow-up as scheduled.  She will benefit from skilled PT services to address deficits in right hip pain and range of motion order improve function at home and with leisure activities.                      PT Short Term Goals - 03/27/21 1419       PT SHORT TERM GOAL #1   Title Pt will be independent with HEP in order to improve strength and decrease hip pain in order to improve pain-free function at home, work, and with leisure activities    Time 4    Period Weeks    Status New    Target Date 04/24/21               PT Long Term Goals - 04/19/21 1005       PT LONG TERM GOAL #1   Title Pt will decrease worst R hip pain as reported on NPRS by at least 3 points in order to demonstrate clinically significant reduction in hip pain.    Baseline 03/27/21: worst: 6/10;  04/19/21: 4    Time 8    Period Weeks    Status Partially Met    Target Date 05/22/21      PT LONG TERM GOAL #2   Title Pt will increase strength of R hip abduction by at least 1/2 MMT grade in order to demonstrate improvement in strength and function related to pelvic stability    Baseline 03/27/21: R hip abduction: 4-/5; 04/19/21: 4/5;    Time 8    Period Weeks    Status Achieved      PT LONG TERM GOAL #3   Title Pt will  report at least 50% improvement in R hip symptoms in order to improve her ability to squat, run, and put on her pants and socks    Baseline 04/19/21: pt unsure, unable to rate    Time 8    Period Weeks    Status On-going    Target Date 05/22/21      PT LONG TERM GOAL #4   Title Pt will improve R hip internal and external range of motion by at least 10 degrees each in order to improve ability to don pants and socks    Baseline 03/27/21: R hip IR: 15, ER: 35; 04/19/21:  R hip IR: 25, ER: 35    Time 8    Period Weeks    Status Partially Met    Target Date 05/22/21                   Plan - 04/26/21 0943     Clinical Impression Statement Patient demonstrates excellent motivation during session today. Continued right hip manual techniques and stretching during session today.  Utilized belt assist mobilizations to improve range of motion and for pain modulation. Continued right hip strengthening with patient.  Patient encouraged to attempt running intervals outdoors to see how her right hip tolerates.  She will need a progress note at the next therapy session. Patient encouraged to follow-up as scheduled.  She will benefit from skilled PT services to address deficits in right hip pain and range of motion order improve function at home and with leisure activities.    Personal Factors and Comorbidities Comorbidity 2;Past/Current Experience;Time since onset of injury/illness/exacerbation    Comorbidities anxiety, depression    Examination-Activity Limitations Squat     Examination-Participation Restrictions Community Activity;Other   Running   Stability/Clinical Decision Making Stable/Uncomplicated    Rehab Potential Good    PT Frequency 2x / week    PT Duration 8 weeks    PT Treatment/Interventions ADLs/Self Care Home Management;Aquatic Therapy;Biofeedback;Canalith Repostioning;Cryotherapy;Electrical Stimulation;Iontophoresis 60m/ml Dexamethasone;Moist Heat;Traction;Ultrasound;Gait training;Stair training;Functional mobility training;Therapeutic activities;Therapeutic exercise;Balance training;Neuromuscular re-education;Patient/family education;Manual techniques;Passive range of motion;Dry needling;Vestibular;Spinal Manipulations;Joint Manipulations    PT Next Visit Plan Update outcome measures and goals, progress note, review HEP, progress manual techniques/stretching, progress strengthening,    PT Home Exercise Plan Access Code: [email or web address]   Consulted and Agree with Plan of Care Patient              Patient will benefit from skilled therapeutic intervention in order to improve the following deficits and impairments:  Decreased range of motion, Pain  Visit Diagnosis: Pain in right hip  Muscle weakness (generalized)  Stiffness of right hip, not elsewhere classified     Problem List Patient Active Problem List   Diagnosis Date Noted   Pain of both hip joints 03/20/2021   Elevated systolic blood pressure reading without diagnosis of hypertension 03/20/2021   JPhillips GroutPT, DPT, GCS  Dequante Tremaine, PT 04/27/2021, 12:44 PM  [place] [facility] [place] [address]Phone: [phone]  Fax:  [phone] Name: ENorie LatendresseMRN: [id number] of Birth: 8April 27, 1977

## 2021-05-01 ENCOUNTER — Ambulatory Visit: Payer: BC Managed Care – PPO

## 2021-05-01 ENCOUNTER — Other Ambulatory Visit: Payer: Self-pay

## 2021-05-01 DIAGNOSIS — M25651 Stiffness of right hip, not elsewhere classified: Secondary | ICD-10-CM | POA: Diagnosis not present

## 2021-05-01 DIAGNOSIS — M25551 Pain in right hip: Secondary | ICD-10-CM

## 2021-05-01 DIAGNOSIS — M6281 Muscle weakness (generalized): Secondary | ICD-10-CM

## 2021-05-01 NOTE — Therapy (Signed)
[facility] [address] [place], [place], [address] Phone: [phone]   Fax:  [phone]  Physical Therapy Progress Note   Dates of reporting period  03/27/21   to   05/01/21  Patient Details  Name: Alexandra Salinas MRN: [id number] Date of Birth: 1975-10-25 Referring Provider (PT): Dr Joseph Berkshire   Encounter Date: 05/01/2021   PT End of Session - 05/01/21 0934     Visit Number 10    Number of Visits 17    Date for PT Re-Evaluation 05/22/21    Authorization Type eval: 03/27/21    PT Start Time 0931    PT Stop Time 1015    PT Time Calculation (min) 44 min    Activity Tolerance Patient tolerated treatment well    Behavior During Therapy [facility] for tasks assessed/performed              Past Medical History:  Diagnosis Date   Anxiety    Depression    Diverticulitis    GERD (gastroesophageal reflux disease)     Past Surgical History:  Procedure Laterality Date   CESAREAN SECTION  2021   COLONOSCOPY  2020   ESOPHAGOGASTRODUODENOSCOPY  11/2020   REFRACTIVE SURGERY Bilateral 2015    There were no vitals filed for this visit.   Subjective Assessment - 05/01/21 0933     Subjective Pt states she is doing well today.  She reports 6/10 right hip pain upon arrival which is more aggravated than typical.  States that she has been compliant with HEP but is unsure exactly which exercise aggravated her pain.  She did run twice since last therapy session performing short intervals and denies any pain while running but reports some soreness later that day.    Pertinent History Pt has a history of bilateral hip pain which began around 8 years ago. It started in her L hip when she was running while training for a 5K race. The L hip pain resolved and has not bothered her since that initial episode. No L hip pain currently but she has been having some intermittent pain in her R hip. Pain in the R hip is located deep in her R  groin. Per patient's report she has had a previous MRI and x-ray out[place]of[place] showing osteoarthritis. Dr. Ashley Royalty repeated plain film imaging of her hips which on the right showed no acute fracture or dislocation. There is mild to moderate  arthritic changes of the right hip with joint space narrowing and spurring. Bone spur and osteophyte noted at the right femoral neck. The bones are well mineralized. The soft tissues are unremarkable. She denies any radiation of her pain or BLE numbness/tingling. Dr. Ashley Royalty started her on meloxicam which she has been on for multiple days without any reported adverse side effects. She has had PT in the past which focused primarily on range of motion and worsened her pain. She only attended 3 sessions. Pt also reporting pain with sexual penetration which started prior to having her daughter 3.5 years ago but worsened afterwards and has persisted. She delivered her daughter via cesarean section. Pt denies bowel/bladder incontinence issues. No pain with bowel movements. ROS negative for red flags.    Limitations Other (comment)   Squating   How long can you sit comfortably? unlimited but stiffness in the R hip once getting up    How long can you stand comfortably? unlimited    How long can you walk comfortably? unlimited  Diagnostic tests see history    Patient Stated Goals Return to running, be able to put on pants and socks easier    Currently in Pain? --    Pain Score --                TREATMENT   Manual Therapy R hip flexor stretch off side of table with therapist blocking pelvis 2 x 45s; Supine R hip figure 4 stretch x 60s; Supine R hip FADIR stretch x 60s; Supine right hip inferior long axis distraction with belt assist by therapist 30 seconds x 2; Supine right hip inferior mobilizations with belt assist and hip flexed at 90 degrees, grade 3, 30 seconds per bout x2 bouts; Supine right hip medial lateral mobilizations with belt assist and hip  flexed 45 degrees grade 3, 30 seconds per about x2 bouts; Supine right hip inferior mobilizations with belt assisted hip in FABER position, grade 3, 30 seconds per bout x2 bouts;   Ther-ex  Treadmill running/walking intervals (4.2/2.0) x 7 minutes with therapist adjusting speed and monitoring for increase in pain. History obtained;  Hooklying clams with manual resistance from therapist 2 x 10; Hooklying adductor squeeze with manual resistance from therapist 2 x 10; Supine R SLR with 2# ankle weight x 10; L sidelying R straight leg hip abduction with 2# ankle weight 2 x 10; TRX curtsy lunges x 10 BLE; Resisted side stepping with blue tband around ankles 20' x 2;   Pt educated throughout session about proper posture and technique with exercises. Improved exercise technique, movement at target joints, use of target muscles after min to mod verbal, visual, tactile cues.    Patient demonstrates excellent motivation during session today.  Due to higher than normal right hip pain at rest deferred treadmill running during session today. Updated outcome measures with patient during session. Her hip pain has been slightly more aggravated today and is at a 6/10 currently.  Deferred measurements of range of motion and strength as they were just performed 2 weeks ago and no change expected since that time.  At that time her R hip IR had improved by 10 degrees and her right hip abduction strength had improved from 4-/5 at initial evaluation to 4/5.  She is unsure if she subjectively notices any improvement in her hip but is very encouraged that she has been able to go outdoors and run. Continued right hip manual techniques and stretching during session today. Utilized belt assist mobilizations to improve range of motion and for pain modulation. Patient will follow-up on Wednesday of this week and then will see Dr. Ashley Royalty early next week to decide what to do moving forward.  If appropriate she is open to  considering an injection in her right hip.  She will also see her OB/GYN tomorrow and will consider discussing a referral to pelvic health PT for ongoing pelvic pain.                      PT Short Term Goals - 03/27/21 1419       PT SHORT TERM GOAL #1   Title Pt will be independent with HEP in order to improve strength and decrease hip pain in order to improve pain-free function at home, work, and with leisure activities    Time 4    Period Weeks    Status New    Target Date 04/24/21  PT Long Term Goals - 05/01/21 1007       PT LONG TERM GOAL #1   Title Pt will decrease worst R hip pain as reported on NPRS by at least 3 points in order to demonstrate clinically significant reduction in hip pain.    Baseline 03/27/21: worst: 6/10; 04/19/21: 4/10; 05/01/21: 6/10    Time 8    Period Weeks    Status On-going    Target Date 05/22/21      PT LONG TERM GOAL #2   Title Pt will increase strength of R hip abduction by at least 1/2 MMT grade in order to demonstrate improvement in strength and function related to pelvic stability    Baseline 03/27/21: R hip abduction: 4-/5; 04/19/21: 4/5;    Time 8    Period Weeks    Status Deferred      PT LONG TERM GOAL #3   Title Pt will report at least 50% improvement in R hip symptoms in order to improve her ability to squat, run, and put on her pants and socks    Baseline 04/19/21: pt unsure, unable to rate; 05/01/21: "about the same"    Time 8    Period Weeks    Status On-going    Target Date 05/22/21      PT LONG TERM GOAL #4   Title Pt will improve R hip internal and external range of motion by at least 10 degrees each in order to improve ability to don pants and socks    Baseline 03/27/21: R hip IR: 15, ER: 35; 04/19/21:  R hip IR: 25, ER: 35;    Time 8    Period Weeks    Status Deferred    Target Date 05/22/21                   Plan - 05/01/21 0934     Clinical Impression Statement Patient  demonstrates excellent motivation during session today.  Due to higher than normal right hip pain at rest deferred treadmill running during session today. Updated outcome measures with patient during session. Her hip pain has been slightly more aggravated today and is at a 6/10 currently.  Deferred measurements of range of motion and strength as they were just performed 2 weeks ago and no change expected since that time.  At that time her R hip IR had improved by 10 degrees and her right hip abduction strength had improved from 4-/5 at initial evaluation to 4/5.  She is unsure if she subjectively notices any improvement in her hip but is very encouraged that she has been able to go outdoors and run. Continued right hip manual techniques and stretching during session today. Utilized belt assist mobilizations to improve range of motion and for pain modulation. Patient will follow-up on Wednesday of this week and then will see Dr. Ashley Royalty early next week to decide what to do moving forward.  If appropriate she is open to considering an injection in her right hip.  She will also see her OB/GYN tomorrow and will consider discussing a referral to pelvic health PT for ongoing pelvic pain.    Personal Factors and Comorbidities Comorbidity 2;Past/Current Experience;Time since onset of injury/illness/exacerbation    Comorbidities anxiety, depression    Examination-Activity Limitations Squat    Examination-Participation Restrictions Community Activity;Other   Running   Stability/Clinical Decision Making Stable/Uncomplicated    Rehab Potential Good    PT Frequency 2x / week    PT Duration 8  weeks    PT Treatment/Interventions ADLs/Self Care Home Management;Aquatic Therapy;Biofeedback;Canalith Repostioning;Cryotherapy;Electrical Stimulation;Iontophoresis 4mg /ml Dexamethasone;Moist Heat;Traction;Ultrasound;Gait training;Stair training;Functional mobility training;Therapeutic activities;Therapeutic exercise;Balance  training;Neuromuscular re-education;Patient/family education;Manual techniques;Passive range of motion;Dry needling;Vestibular;Spinal Manipulations;Joint Manipulations    PT Next Visit Plan review HEP, progress manual techniques/stretching, progress strengthening,    PT Home Exercise Plan Access Code: [email or web address]    Consulted and Agree with Plan of Care Patient              Patient will benefit from skilled therapeutic intervention in order to improve the following deficits and impairments:  Decreased range of motion, Pain  Visit Diagnosis: Pain in right hip  Muscle weakness (generalized)     Problem List Patient Active Problem List   Diagnosis Date Noted   Pain of both hip joints 03/20/2021   Elevated systolic blood pressure reading without diagnosis of hypertension 03/20/2021   03/22/2021 PT, DPT, GCS  Yehoshua Vitelli, PT 05/01/2021, 10:42 AM  [facility] [address] [place], [place], [place] Phone: [phone]   Fax:  [phone]  Name: Alphonsine Minium MRN: [id number] Ganser Date of Birth: 1975/10/20

## 2021-05-02 ENCOUNTER — Ambulatory Visit
Admission: RE | Admit: 2021-05-02 | Discharge: 2021-05-02 | Disposition: A | Discharge status: Home / Self Care | Payer: BC Managed Care – PPO | Source: Ambulatory Visit | Attending: Obstetrics and Gynecology | Admitting: Obstetrics and Gynecology

## 2021-05-02 ENCOUNTER — Encounter: Payer: Self-pay | Admitting: Obstetrics and Gynecology

## 2021-05-02 ENCOUNTER — Ambulatory Visit (INDEPENDENT_AMBULATORY_CARE_PROVIDER_SITE_OTHER): Payer: BC Managed Care – PPO | Admitting: Obstetrics and Gynecology

## 2021-05-02 ENCOUNTER — Other Ambulatory Visit (HOSPITAL_COMMUNITY)
Admission: RE | Admit: 2021-05-02 | Discharge: 2021-05-02 | Disposition: A | Discharge status: Home / Self Care | Payer: BC Managed Care – PPO | Source: Ambulatory Visit | Attending: Obstetrics and Gynecology | Admitting: Obstetrics and Gynecology

## 2021-05-02 VITALS — BP 121/75 | HR 70 | Wt 191.0 lb

## 2021-05-02 DIAGNOSIS — Z01419 Encounter for gynecological examination (general) (routine) without abnormal findings: Secondary | ICD-10-CM | POA: Insufficient documentation

## 2021-05-02 DIAGNOSIS — Z1231 Encounter for screening mammogram for malignant neoplasm of breast: Secondary | ICD-10-CM | POA: Diagnosis not present

## 2021-05-02 DIAGNOSIS — N951 Menopausal and female climacteric states: Secondary | ICD-10-CM | POA: Insufficient documentation

## 2021-05-02 DIAGNOSIS — N941 Unspecified dyspareunia: Secondary | ICD-10-CM

## 2021-05-02 NOTE — Progress Notes (Signed)
LMP: 04/19/21  Last Pap: 05/2019

## 2021-05-02 NOTE — Progress Notes (Signed)
Obstetrics and Gynecology New Patient Evaluation  Appointment Date: 05/02/2021  OBGYN Clinic: [facility]  Primary Care Provider: Jerrol Salinas  Chief Complaint: Annual  History of Present Illness: Alexandra Salinas is a 45 y.o. Caucasian [phone] (Patient's last menstrual period was 04/19/2021.), seen for the above chief complaint. Her past medical history is significant for c-section x 1   Patient with dyspareunia since birth of last child. She states it happens throughout intercourse (insertion and deeper penetration). She denies any vaginal dryness or irritation. She saw a pelvic floor PT for a one time consult and they gave her some behavioral techniques to help with increased urinary frequency overnight and that has helped considerably, but nothing obvious noted on their assessment or specific recommendations for the dyspareunia.   No menopausal s/s  Review of Systems: Pertinent items noted in HPI and remainder of comprehensive ROS otherwise negative.    Patient Active Problem List   Diagnosis Date Noted   Dyspareunia in female 05/02/2021   Pain of both hip joints 03/20/2021   Elevated systolic blood pressure reading without diagnosis of hypertension 03/20/2021    Past Medical History:  Past Medical History:  Diagnosis Date   Anxiety    Depression    Diverticulitis    GERD (gastroesophageal reflux disease)     Past Surgical History:  Past Surgical History:  Procedure Laterality Date   CESAREAN SECTION  2021   COLONOSCOPY  2020   ESOPHAGOGASTRODUODENOSCOPY  11/2020   REFRACTIVE SURGERY Bilateral 2015    Past Obstetrical History:  OB History  Gravida Para Term Preterm AB Living  3       2 1   SAB IAB Ectopic Multiple Live Births  2       1    # Outcome Date GA Lbr Len/2nd Weight Sex Delivery Anes PTL Lv  3 Gravida           2 SAB           1 SAB             Obstetric Comments  C-section x 1    Past Gynecological  History: As per HPI. Periods: every 28 days, 5 days, not heavy or painful. No intermenstrual bleeding History of Pap Smear(s): Yes.   Last pap ?2021, which was normal She is currently using no method for contraception.   Social History:  Social History   Socioeconomic History   Marital status: Married    Spouse name: 2022   Number of children: 1   Years of education: 16   Highest education level: Bachelor's degree (e.g., BA, AB, BS)  Occupational History   Not on file  Tobacco Use   Smoking status: Former    Packs/day: 1.00    Years: 20.00    Pack years: 20.00    Types: Cigarettes    Quit date: 2017    Years since quitting: 5.9   Smokeless tobacco: Never  Vaping Use   Vaping Use: Never used  Substance and Sexual Activity   Alcohol use: Yes    Alcohol/week: 12.0 standard drinks    Types: 12 Standard drinks or equivalent per week   Drug use: Never   Sexual activity: Yes  Other Topics Concern   Not on file  Social History Narrative   Not on file   Social Determinants of Health   Financial Resource Strain: Not on file  Food Insecurity: Not on file  Transportation Needs: Not on file  Physical Activity: Not on file  Stress: Not on file  Social Connections: Not on file  Intimate Partner Violence: Not on file    Family History:  Family History  Problem Relation Age of Onset   Depression Mother    Kidney disease Mother    Hypertension Mother    Diabetes Mother    Vascular Disease Mother    Dementia Mother    Stroke Mother    Heart disease Father    Colon cancer Father    Hyperlipidemia Brother    Hypertension Brother    Depression Brother    Alcoholism Brother    Diabetes Maternal Grandmother    Heart disease Maternal Grandmother    Stroke Maternal Grandfather    Lung cancer Paternal Grandmother     Medications Alexandra Salinas had no medications administered during this visit. Current Outpatient Medications  Medication Sig Dispense  Refill   busPIRone (BUSPAR) 5 MG tablet Take 5 mg by mouth 2 (two) times daily.     famotidine (PEPCID) 20 MG tablet Take 20 mg by mouth 2 (two) times daily.     No current facility-administered medications for this visit.    Allergies Patient has no known allergies.   Physical Exam:  BP 121/75   Pulse 70   Wt 191 lb (86.6 kg)   LMP 04/19/2021   BMI 30.83 kg/m  Body mass index is 30.83 kg/m. General appearance: Well nourished, well developed female in no acute distress.  Cardiovascular: normal s1 and s2.  No murmurs, rubs or gallops. Respiratory:  Clear to auscultation bilateral. Normal respiratory effort Abdomen: positive bowel sounds and no masses, hernias; diffusely non tender to palpation, non distended. Well healed low transverse skin incision, nttp Breasts: breasts appear normal, no suspicious masses, no skin or nipple changes or axillary nodes, and normal palpation. Neuro/Psych:  Normal mood and affect.  Skin:  Warm and dry.  Lymphatic:  No inguinal lymphadenopathy.   Pelvic exam: is not limited by body habitus EGBUS: within normal limits Vagina: within normal limits and with no blood or discharge in the vault Cervix: normal appearing cervix without tenderness, discharge or lesions.  Uterus:  nonenlarged and non tender Adnexa:  normal adnexa and no mass, fullness, tenderness Rectovaginal: deferred  Laboratory: none  Radiology: none  Assessment: pt stable  Plan:  1. Well woman exam with routine gynecological exam Routine care. Pt declines STI testing - MM 3D SCREEN BREAST BILATERAL; Future - Cytology - PAP( [place]) - Cervicovaginal ancillary only( [place])  2. Dyspareunia in female Normal exam and nothing replicated on exam today. Will get early cycle pelvic u/s.  - Cervicovaginal ancillary only( [place])  RTC after u/s  Cornelia Copa MD Attending [facility])

## 2021-05-03 ENCOUNTER — Ambulatory Visit: Payer: BC Managed Care – PPO

## 2021-05-03 ENCOUNTER — Other Ambulatory Visit: Payer: Self-pay

## 2021-05-03 DIAGNOSIS — M25651 Stiffness of right hip, not elsewhere classified: Secondary | ICD-10-CM

## 2021-05-03 DIAGNOSIS — M6281 Muscle weakness (generalized): Secondary | ICD-10-CM

## 2021-05-03 DIAGNOSIS — M25551 Pain in right hip: Secondary | ICD-10-CM

## 2021-05-03 LAB — CERVICOVAGINAL ANCILLARY ONLY
Bacterial Vaginitis (gardnerella): NEGATIVE
Candida Glabrata: NEGATIVE
Candida Vaginitis: NEGATIVE
Comment: NEGATIVE
Comment: NEGATIVE
Comment: NEGATIVE

## 2021-05-03 LAB — CYTOLOGY - PAP
Comment: NEGATIVE
Diagnosis: NEGATIVE
High risk HPV: NEGATIVE

## 2021-05-03 NOTE — Therapy (Signed)
[facility] [address] [place], [place], [address] Phone: [phone]   Fax:  [phone]  Physical Therapy Treatment/Discharge  Patient Details  Name: Alexandra Salinas MRN: [id number] Date of Birth: 17-Jun-1975 Referring Provider (PT): Dr Rosette Reveal   Encounter Date: 05/03/2021   PT End of Session - 05/03/21 1318     Visit Number 11    Number of Visits 17    Date for PT Re-Evaluation 05/22/21    Authorization Type eval: 03/27/21    PT Start Time 0935    PT Stop Time 1020    PT Time Calculation (min) 45 min    Activity Tolerance Patient tolerated treatment well    Behavior During Therapy [facility] for tasks assessed/performed               Past Medical History:  Diagnosis Date   Anxiety    Depression    Diverticulitis    GERD (gastroesophageal reflux disease)     Past Surgical History:  Procedure Laterality Date   CESAREAN SECTION  2021   COLONOSCOPY  2020   ESOPHAGOGASTRODUODENOSCOPY  11/2020   REFRACTIVE SURGERY Bilateral 2015    There were no vitals filed for this visit.   Subjective Assessment - 05/03/21 1314     Subjective Pt states she is doing well today.  She denies any yhip pain upon arrival.  States that she has been compliant with HEP. She has not gone on any runs or worked out since the last hterapy session. She saw the OBGYN yesterday and discussed her pelvic pain. Her MD ordered an ultrasound for further assessment. She has an appointment to follow-up with Dr. Zigmund Daniel next week.    Pertinent History Pt has a history of bilateral hip pain which began around 8 years ago. It started in her L hip when she was running while training for a 5K race. The L hip pain resolved and has not bothered her since that initial episode. No L hip pain currently but she has been having some intermittent pain in her R hip. Pain in the R hip is located deep in her R groin. Per patient's report she has had a previous  MRI and [place] showing osteoarthritis. Dr. Zigmund Daniel repeated plain film imaging of her hips which on the right showed no acute fracture or dislocation. There is mild to moderate  arthritic changes of the right hip with joint space narrowing and spurring. Bone spur and osteophyte noted at the right femoral neck. The bones are well mineralized. The soft tissues are unremarkable. She denies any radiation of her pain or BLE numbness/tingling. Dr. Zigmund Daniel started her on meloxicam which she has been on for multiple days without any reported adverse side effects. She has had PT in the past which focused primarily on range of motion and worsened her pain. She only attended 3 sessions. Pt also reporting pain with sexual penetration which started prior to having her daughter 3.5 years ago but worsened afterwards and has persisted. She delivered her daughter via cesarean section. Pt denies bowel/bladder incontinence issues. No pain with bowel movements. ROS negative for red flags.    Limitations Other (comment)   Squating   How long can you sit comfortably? unlimited but stiffness in the R hip once getting up    How long can you stand comfortably? unlimited    How long can you walk comfortably? unlimited    Diagnostic tests see history    Patient Stated  Goals Return to running, be able to put on pants and socks easier    Currently in Pain? No/denies                 TREATMENT   Manual Therapy R hip flexor stretch off side of table with therapist blocking pelvis 2 x 45s; Supine R hip figure 4 stretch x 60s; Supine R hip FADIR stretch x 60s; Supine right hip inferior long axis distraction with belt assist by therapist 30 seconds x 2; Supine right hip inferior mobilizations with belt assist and hip flexed at 90 degrees, grade 3, 30 seconds per bout x2 bouts; Supine right hip medial lateral mobilizations with belt assist and hip flexed 45 degrees grade 3, 30 seconds per about x2  bouts; Supine right hip inferior mobilizations with belt assisted hip in FABER position, grade 3, 30 seconds per bout x2 bouts;   Ther-ex  Treadmill running/walking intervals (4.3/2.0) x 9 minutes with therapist adjusting speed and monitoring for increase in pain. History obtained, no pain reported;  Hooklying clams with manual resistance from therapist 2 x 10; Hooklying adductor squeeze with manual resistance from therapist 2 x 10; Supine R SLR x 10; L sidelying R straight leg hip abduction with manual resistance from therapist x 10; L sidelying R reverse clams with manual resistance therapist x 10; Discussed temporary discharge;   Pt educated throughout session about proper posture and technique with exercises. Improved exercise technique, movement at target joints, use of target muscles after min to mod verbal, visual, tactile cues.    Patient demonstrates excellent motivation during session today. Updated outcome measures with patient during last session. Deferred measurements of range of motion and strength as they were just performed 2 weeks ago and no change expected since that time. At that time her R hip IR had improved by 10 degrees and her right hip abduction strength had improved from 4-/5 at initial evaluation to 4/5.  She is unsure if she subjectively notices any improvement in her hip but is very encouraged that she has been able to go outdoors and run. Continued right hip manual techniques and stretching during session today. Utilized belt assist mobilizations to improve range of motion and for pain modulation. Patient will see Dr. Zigmund Daniel early next week to decide what to do moving forward.  If appropriate she is open to considering an injection in her right hip. Discussed possible return for additional therapy after meeting with Dr. Zigmund Daniel.                      PT Short Term Goals - 05/03/21 1325       PT SHORT TERM GOAL #1   Title Pt will be independent  with HEP in order to improve strength and decrease hip pain in order to improve pain-free function at home, work, and with leisure activities    Time 4    Period Weeks    Status Achieved    Target Date 04/24/21               PT Long Term Goals - 05/03/21 1325       PT LONG TERM GOAL #1   Title Pt will decrease worst R hip pain as reported on NPRS by at least 3 points in order to demonstrate clinically significant reduction in hip pain.    Baseline 03/27/21: worst: 6/10; 04/19/21: 4/10; 05/01/21: 6/10    Time 8    Period Weeks  Status On-going      PT LONG TERM GOAL #2   Title Pt will increase strength of R hip abduction by at least 1/2 MMT grade in order to demonstrate improvement in strength and function related to pelvic stability    Baseline 03/27/21: R hip abduction: 4-/5; 04/19/21: 4/5;    Time 8    Period Weeks    Status Achieved      PT LONG TERM GOAL #3   Title Pt will report at least 50% improvement in R hip symptoms in order to improve her ability to squat, run, and put on her pants and socks    Baseline 04/19/21: pt unsure, unable to rate; 05/01/21: "about the same"    Time 8    Period Weeks    Status On-going      PT LONG TERM GOAL #4   Title Pt will improve R hip internal and external range of motion by at least 10 degrees each in order to improve ability to don pants and socks    Baseline 03/27/21: R hip IR: 15, ER: 35; 04/19/21:  R hip IR: 25, ER: 35;    Time 8    Period Weeks    Status Partially Met                   Plan - 05/03/21 1318     Clinical Impression Statement Patient demonstrates excellent motivation during session today. Updated outcome measures with patient during last session. Deferred measurements of range of motion and strength as they were just performed 2 weeks ago and no change expected since that time. At that time her R hip IR had improved by 10 degrees and her right hip abduction strength had improved from 4-/5 at initial  evaluation to 4/5.  She is unsure if she subjectively notices any improvement in her hip but is very encouraged that she has been able to go outdoors and run. Continued right hip manual techniques and stretching during session today. Utilized belt assist mobilizations to improve range of motion and for pain modulation. Patient will see Dr. Zigmund Daniel early next week to decide what to do moving forward.  If appropriate she is open to considering an injection in her right hip. Discussed possible return for additional therapy after meeting with Dr. Zigmund Daniel.    Personal Factors and Comorbidities Comorbidity 2;Past/Current Experience;Time since onset of injury/illness/exacerbation    Comorbidities anxiety, depression    Examination-Activity Limitations Squat    Examination-Participation Restrictions Community Activity;Other   Running   Stability/Clinical Decision Making Stable/Uncomplicated    Rehab Potential Good    PT Frequency 2x / week    PT Duration 8 weeks    PT Treatment/Interventions ADLs/Self Care Home Management;Aquatic Therapy;Biofeedback;Canalith Repostioning;Cryotherapy;Electrical Stimulation;Iontophoresis 17m/ml Dexamethasone;Moist Heat;Traction;Ultrasound;Gait training;Stair training;Functional mobility training;Therapeutic activities;Therapeutic exercise;Balance training;Neuromuscular re-education;Patient/family education;Manual techniques;Passive range of motion;Dry needling;Vestibular;Spinal Manipulations;Joint Manipulations    PT Next Visit Plan Temporary discharge    PT Home Exercise Plan Access Code: [email or web address]   Consulted and Agree with Plan of Care Patient               Patient will benefit from skilled therapeutic intervention in order to improve the following deficits and impairments:  Decreased range of motion, Pain  Visit Diagnosis: Pain in right hip  Muscle weakness (generalized)  Stiffness of right hip, not elsewhere classified     Problem List Patient Active  Problem List   Diagnosis Date Noted   Dyspareunia in female 05/02/2021  Pain of both hip joints 03/20/2021   Elevated systolic blood pressure reading without diagnosis of hypertension 03/20/2021   Phillips Grout PT, DPT, GCS  Shaneya Taketa, PT 05/03/2021, 1:27 PM  [facility] [address] [place], [place], [address] Phone: [phone]   Fax:  [phone]  Name: Alexandra Salinas MRN: [id number] Date of Birth: 1975/11/19

## 2021-05-09 ENCOUNTER — Ambulatory Visit (INDEPENDENT_AMBULATORY_CARE_PROVIDER_SITE_OTHER): Payer: BC Managed Care – PPO | Admitting: Family Medicine

## 2021-05-09 ENCOUNTER — Encounter: Payer: Self-pay | Admitting: Family Medicine

## 2021-05-09 ENCOUNTER — Other Ambulatory Visit: Payer: Self-pay

## 2021-05-09 VITALS — BP 128/92 | HR 66 | Ht 66.0 in | Wt 191.0 lb

## 2021-05-09 DIAGNOSIS — Z13220 Encounter for screening for lipoid disorders: Secondary | ICD-10-CM

## 2021-05-09 DIAGNOSIS — Z1159 Encounter for screening for other viral diseases: Secondary | ICD-10-CM

## 2021-05-09 DIAGNOSIS — Z0000 Encounter for general adult medical examination without abnormal findings: Secondary | ICD-10-CM | POA: Diagnosis not present

## 2021-05-09 DIAGNOSIS — R030 Elevated blood-pressure reading, without diagnosis of hypertension: Secondary | ICD-10-CM

## 2021-05-09 DIAGNOSIS — Z114 Encounter for screening for human immunodeficiency virus [HIV]: Secondary | ICD-10-CM | POA: Diagnosis not present

## 2021-05-09 DIAGNOSIS — M1612 Unilateral primary osteoarthritis, left hip: Secondary | ICD-10-CM

## 2021-05-09 DIAGNOSIS — M1611 Unilateral primary osteoarthritis, right hip: Secondary | ICD-10-CM

## 2021-05-09 DIAGNOSIS — E559 Vitamin D deficiency, unspecified: Secondary | ICD-10-CM

## 2021-05-09 NOTE — Progress Notes (Signed)
Annual Physical Exam Visit  Patient Information:  Patient ID: Alexandra Salinas, female DOB: December 20, 1975 Age: 45 y.o. MRN: [id number]   Subjective:   CC: Annual Physical Exam  HPI:  Alexandra Salinas is here for their annual physical.  I reviewed the past medical history, family history, social history, surgical history, and allergies today and changes were made as necessary.  Please see the problem list section below for additional details.  Past Medical History: Past Medical History:  Diagnosis Date   Anxiety    Depression    Diverticulitis    GERD (gastroesophageal reflux disease)    Past Surgical History: Past Surgical History:  Procedure Laterality Date   CESAREAN SECTION  2021   COLONOSCOPY  2020   ESOPHAGOGASTRODUODENOSCOPY  11/2020   REFRACTIVE SURGERY Bilateral 2015   Family History: Family History  Problem Relation Age of Onset   Depression Mother    Kidney disease Mother    Hypertension Mother    Diabetes Mother    Vascular Disease Mother    Dementia Mother    Stroke Mother    Heart disease Father    Colon cancer Father    Hyperlipidemia Brother    Hypertension Brother    Depression Brother    Alcoholism Brother    Diabetes Maternal Grandmother    Heart disease Maternal Grandmother    Stroke Maternal Grandfather    Lung cancer Paternal Grandmother    Allergies: No Known Allergies Health Maintenance: Health Maintenance  Topic Date Due   Hepatitis C Screening  Never done   TETANUS/TDAP  Never done   COVID-19 Vaccine (1) 06/20/2021 (Originally 07/19/1976)   INFLUENZA VACCINE  08/18/2021 (Originally 12/19/2020)   PAP SMEAR-Modifier  05/02/2024   COLONOSCOPY (Pts 45-18yrs Insurance coverage will need to be confirmed)  05/21/2028   HIV Screening  Completed   Pneumococcal Vaccine 81-30 Years old  Aged Out   HPV VACCINES  Aged Out    HM Colonoscopy           COLONOSCOPY (Pts 45-89yrs Insurance coverage will need to be confirmed) (Every  10 Years) Next due on 05/21/2028    05/21/2018  Done - [place]           Medications: Current Outpatient Medications on File Prior to Visit  Medication Sig Dispense Refill   busPIRone (BUSPAR) 5 MG tablet Take 5 mg by mouth 2 (two) times daily.     escitalopram (LEXAPRO) 20 MG tablet Take 20 mg by mouth daily.     famotidine (PEPCID) 20 MG tablet Take 20 mg by mouth 2 (two) times daily.     No current facility-administered medications on file prior to visit.    Review of Systems: No headache, visual changes, nausea, vomiting, diarrhea, constipation, dizziness, abdominal pain, skin rash, fevers, chills, night sweats, swollen lymph nodes, weight loss, chest pain, body aches, joint swelling, muscle aches, shortness of breath, mood changes, visual or auditory hallucinations reported.  Objective:   Vitals:   05/09/21 1503  BP: (!) 128/92  Pulse: 66  SpO2: 97%   Vitals:   05/09/21 1503  Weight: 191 lb (86.6 kg)  Height: 5\' 6"  (1.676 m)   Body mass index is 30.83 kg/m.  General: Well Developed, well nourished, and in no acute distress.  Neuro: Alert and oriented x3, extra-ocular muscles intact, sensation grossly intact. Cranial nerves II through XII are grossly intact, motor, sensory, and coordinative functions are intact. HEENT: Normocephalic, atraumatic, pupils equal round reactive to  light, neck supple, no masses, no lymphadenopathy, thyroid nonpalpable. Oropharynx, nasopharynx, external ear canals are unremarkable. Skin: Warm and dry, no rashes noted.  Cardiac: Regular rate and rhythm, no murmurs rubs or gallops. No peripheral edema. Pulses symmetric. Respiratory: Clear to auscultation bilaterally. Not using accessory muscles, speaking in full sentences.  Abdominal: Soft, nontender, nondistended, positive bowel sounds, no masses, no organomegaly. Musculoskeletal: Shoulder, elbow, wrist, hip, knee, ankle stable, and with full range of motion.  Female chaperone initials: BN present  throughout the physical examination.  Impression and Recommendations:   The patient was counselled, risk factors were discussed, and anticipatory guidance given.  Primary osteoarthritis of right hip Patient has demonstrated excellent interval improvement following compliance with physical therapy and home exercise regimen.  She relays minimal benefit from initial course of meloxicam.  Recent x-rays do reveal severe involvement of hip osteoarthritis on the right, mild-moderate osteoarthritis on the left hip.  Given her reported excellent symptomatology, clearance by PT, I have encouraged her to continue regular home-based daily maintenance rehab exercises for an additional month then transition to weekly exercises.  She can also ramp up her running to her previous baseline.  I have advised her to monitor symptoms, symptom recurrence, and to contact us for any hip pain that may pose a barrier to her progression.  Plan for follow-up in 2 months for reevaluation.  For suboptimal progress, ultrasound-guided corticosteroid injections versus oral pharmacotherapy management to be considered.  Primary osteoarthritis of left hip See additional assessment(s) for plan details.  Annual physical exam Annual examination completed, risk stratification labs ordered, anticipatory guidance provided.  Patient has deferred annual influenza vaccine, has recently completed cervical cancer screening through gynecology, and has had prior colonoscopy; will obtain outside records to update around.  We will follow labs once resulted.   Elevated systolic blood pressure reading without diagnosis of hypertension Asymptomatic patient, incidentally noted at initial visit, improvement though still present at this visit.  Over the interval she has seen gynecology where normal BP readings were noted.  Plan for labs as part of her annual physical, these will include risk stratification work-up.  She will return for follow-up in 2  months for BP recheck, pending blood pressure at that visit, risk stratification labs, can consider further monitoring versus pharmacotherapy.   Orders & Medications Medications: No orders of the defined types were placed in this encounter.  Orders Placed This Encounter  Procedures   TSH Rfx on Abnormal to Free T4   Lipid panel   Apo A1 + B + Ratio   CBC with Differential/Platelet   Comprehensive metabolic panel   VITAMIN D 25 Hydroxy (Vit-D Deficiency, Fractures)   Hepatitis C antibody   HIV Antibody (routine testing w rflx)     Return in about 2 months (around 07/10/2021).    Jerrol Banana, MD   Primary Care Sports Medicine [facility]

## 2021-05-09 NOTE — Assessment & Plan Note (Signed)
See additional assessment(s) for plan details. 

## 2021-05-09 NOTE — Patient Instructions (Addendum)
-   Obtain fasting labs with orders provided (can have water or black coffee but otherwise no food or drink x 8 hours before labs) - Continue regular physical therapy home exercises - Review information provided - Attend eye doctor annually, dentist every 6 months, work towards or maintain 30 minutes of moderate intensity physical activity at least 5 days per week, and consume a balanced diet - Return in 2 months for follow-up - Contact us for any questions between now and then

## 2021-05-09 NOTE — Assessment & Plan Note (Signed)
Annual examination completed, risk stratification labs ordered, anticipatory guidance provided.  Patient has deferred annual influenza vaccine, has recently completed cervical cancer screening through gynecology, and has had prior colonoscopy; will obtain outside records to update around.  We will follow labs once resulted.

## 2021-05-09 NOTE — Assessment & Plan Note (Signed)
Asymptomatic patient, incidentally noted at initial visit, improvement though still present at this visit.  Over the interval she has seen gynecology where normal BP readings were noted.  Plan for labs as part of her annual physical, these will include risk stratification work-up.  She will return for follow-up in 2 months for BP recheck, pending blood pressure at that visit, risk stratification labs, can consider further monitoring versus pharmacotherapy.

## 2021-05-09 NOTE — Assessment & Plan Note (Signed)
Patient has demonstrated excellent interval improvement following compliance with physical therapy and home exercise regimen.  She relays minimal benefit from initial course of meloxicam.  Recent x-rays do reveal severe involvement of hip osteoarthritis on the right, mild-moderate osteoarthritis on the left hip.  Given her reported excellent symptomatology, clearance by PT, I have encouraged her to continue regular home-based daily maintenance rehab exercises for an additional month then transition to weekly exercises.  She can also ramp up her running to her previous baseline.  I have advised her to monitor symptoms, symptom recurrence, and to contact us for any hip pain that may pose a barrier to her progression.  Plan for follow-up in 2 months for reevaluation.  For suboptimal progress, ultrasound-guided corticosteroid injections versus oral pharmacotherapy management to be considered.

## 2021-05-12 DIAGNOSIS — Z13220 Encounter for screening for lipoid disorders: Secondary | ICD-10-CM | POA: Diagnosis not present

## 2021-05-12 DIAGNOSIS — R7301 Impaired fasting glucose: Secondary | ICD-10-CM | POA: Diagnosis not present

## 2021-05-12 DIAGNOSIS — Z114 Encounter for screening for human immunodeficiency virus [HIV]: Secondary | ICD-10-CM | POA: Diagnosis not present

## 2021-05-12 DIAGNOSIS — E559 Vitamin D deficiency, unspecified: Secondary | ICD-10-CM | POA: Diagnosis not present

## 2021-05-12 DIAGNOSIS — Z0000 Encounter for general adult medical examination without abnormal findings: Secondary | ICD-10-CM | POA: Diagnosis not present

## 2021-05-12 DIAGNOSIS — Z1159 Encounter for screening for other viral diseases: Secondary | ICD-10-CM | POA: Diagnosis not present

## 2021-05-13 LAB — CBC WITH DIFFERENTIAL/PLATELET
Basophils Absolute: 0.1 10*3/uL (ref 0.0–0.2)
Basos: 1 %
EOS (ABSOLUTE): 0.3 10*3/uL (ref 0.0–0.4)
Eos: 4 %
Hematocrit: 41.1 % (ref 34.0–46.6)
Hemoglobin: 13.3 g/dL (ref 11.1–15.9)
Immature Grans (Abs): 0.1 10*3/uL (ref 0.0–0.1)
Immature Granulocytes: 1 %
Lymphocytes Absolute: 2.1 10*3/uL (ref 0.7–3.1)
Lymphs: 25 %
MCH: 29.2 pg (ref 26.6–33.0)
MCHC: 32.4 g/dL (ref 31.5–35.7)
MCV: 90 fL (ref 79–97)
Monocytes Absolute: 0.6 10*3/uL (ref 0.1–0.9)
Monocytes: 7 %
Neutrophils Absolute: 5.3 10*3/uL (ref 1.4–7.0)
Neutrophils: 62 %
Platelets: 177 10*3/uL (ref 150–450)
RBC: 4.56 x10E6/uL (ref 3.77–5.28)
RDW: 12.3 % (ref 11.7–15.4)
WBC: 8.5 10*3/uL (ref 3.4–10.8)

## 2021-05-13 LAB — APO A1 + B + RATIO
Apolipo. B/A-1 Ratio: 0.7 ratio — ABNORMAL HIGH (ref 0.0–0.6)
Apolipoprotein A-1: 162 mg/dL (ref 116–209)
Apolipoprotein B: 106 mg/dL — ABNORMAL HIGH (ref <90)

## 2021-05-13 LAB — LIPID PANEL
Chol/HDL Ratio: 4 ratio (ref 0.0–4.4)
Cholesterol, Total: 218 mg/dL — ABNORMAL HIGH (ref 100–199)
HDL: 55 mg/dL (ref >39)
LDL Chol Calc (NIH): 129 mg/dL — ABNORMAL HIGH (ref 0–99)
Triglycerides: 193 mg/dL — ABNORMAL HIGH (ref 0–149)
VLDL Cholesterol Cal: 34 mg/dL (ref 5–40)

## 2021-05-13 LAB — VITAMIN D 25 HYDROXY (VIT D DEFICIENCY, FRACTURES): Vit D, 25-Hydroxy: 27.6 ng/mL — ABNORMAL LOW (ref 30.0–100.0)

## 2021-05-13 LAB — COMPREHENSIVE METABOLIC PANEL
ALT: 18 IU/L (ref 0–32)
AST: 24 IU/L (ref 0–40)
Albumin/Globulin Ratio: 1.6 (ref 1.2–2.2)
Albumin: 4.4 g/dL (ref 3.8–4.8)
Alkaline Phosphatase: 58 IU/L (ref 44–121)
BUN/Creatinine Ratio: 17 (ref 9–23)
BUN: 14 mg/dL (ref 6–24)
Bilirubin Total: 0.3 mg/dL (ref 0.0–1.2)
CO2: 22 mmol/L (ref 20–29)
Calcium: 9.3 mg/dL (ref 8.7–10.2)
Chloride: 102 mmol/L (ref 96–106)
Creatinine, Ser: 0.81 mg/dL (ref 0.57–1.00)
Globulin, Total: 2.7 g/dL (ref 1.5–4.5)
Glucose: 106 mg/dL — ABNORMAL HIGH (ref 70–99)
Potassium: 4.2 mmol/L (ref 3.5–5.2)
Sodium: 137 mmol/L (ref 134–144)
Total Protein: 7.1 g/dL (ref 6.0–8.5)
eGFR: 90 mL/min/{1.73_m2} (ref >59)

## 2021-05-13 LAB — HEPATITIS C ANTIBODY: Hep C Virus Ab: <0.1 s/co ratio (ref 0.0–0.9)

## 2021-05-13 LAB — TSH RFX ON ABNORMAL TO FREE T4: TSH: 2.01 u[IU]/mL (ref 0.450–4.500)

## 2021-05-13 LAB — HIV ANTIBODY (ROUTINE TESTING W REFLEX): HIV Screen 4th Generation wRfx: NONREACTIVE

## 2021-05-16 ENCOUNTER — Other Ambulatory Visit: Payer: Self-pay | Admitting: Family Medicine

## 2021-05-16 DIAGNOSIS — E559 Vitamin D deficiency, unspecified: Secondary | ICD-10-CM

## 2021-05-16 MED ORDER — VITAMIN D (ERGOCALCIFEROL) 1.25 MG (50000 UNIT) PO CAPS
50000.0000 [IU] | ORAL_CAPSULE | ORAL | 0 refills | Status: DC
Start: 2021-05-16 — End: 2021-09-13

## 2021-05-17 ENCOUNTER — Ambulatory Visit: Payer: BC Managed Care – PPO | Admitting: Gastroenterology

## 2021-05-17 LAB — SPECIMEN STATUS REPORT

## 2021-05-17 LAB — HGB A1C W/O EAG: Hgb A1c MFr Bld: 5.2 % (ref 4.8–5.6)

## 2021-05-19 ENCOUNTER — Encounter: Payer: Self-pay | Admitting: Family Medicine

## 2021-05-21 DIAGNOSIS — C4491 Basal cell carcinoma of skin, unspecified: Secondary | ICD-10-CM

## 2021-05-21 HISTORY — DX: Basal cell carcinoma of skin, unspecified: C44.91

## 2021-05-26 ENCOUNTER — Ambulatory Visit: Payer: BC Managed Care – PPO

## 2021-06-07 ENCOUNTER — Other Ambulatory Visit: Payer: Self-pay

## 2021-06-07 ENCOUNTER — Encounter: Payer: Self-pay | Admitting: Family Medicine

## 2021-06-07 ENCOUNTER — Ambulatory Visit (INDEPENDENT_AMBULATORY_CARE_PROVIDER_SITE_OTHER): Payer: BC Managed Care – PPO | Admitting: Family Medicine

## 2021-06-07 VITALS — BP 108/78 | HR 66 | Ht 66.0 in | Wt 193.0 lb

## 2021-06-07 DIAGNOSIS — M62838 Other muscle spasm: Secondary | ICD-10-CM

## 2021-06-07 DIAGNOSIS — G44209 Tension-type headache, unspecified, not intractable: Secondary | ICD-10-CM

## 2021-06-07 MED ORDER — CYCLOBENZAPRINE HCL 5 MG PO TABS: 5.0000 mg | ORAL_TABLET | Freq: Every evening | ORAL | 0 refills | Status: DC | PRN

## 2021-06-07 MED ORDER — MELOXICAM 15 MG PO TABS: 15.0000 mg | ORAL_TABLET | Freq: Every day | ORAL | 0 refills | Status: DC

## 2021-06-07 NOTE — Assessment & Plan Note (Signed)
4-day history of tension type headache pattern in the setting of noted bilateral cervical paraspinal spasm localized about the trapezius and levator scapulae.  Fortunately no sensorimotor deficits or positive Spurling's noted.  I have advised scheduled anti-inflammatories that were prescribed today in addition to as needed skeletal muscle relaxer.  Home-based rehab and follow-up in 4 weeks advised.  For recalcitrant symptomatology, x-ray to be ordered.

## 2021-06-07 NOTE — Patient Instructions (Signed)
-   Dose meloxicam once daily with food x2 weeks - After 2 weeks, dose daily on an as-needed basis for headache - Can dose 1-2 tablets muscle relaxer at night on an as-needed basis for muscle tightness pain - Start home exercises with information provided x4 weeks - Hold from your typical upper body workout routine x2 weeks then gradually return to full activity - Contact us for any lingering symptoms at the 4-week mark or beyond

## 2021-06-07 NOTE — Progress Notes (Signed)
Primary Care / Sports Medicine Office Visit  Patient Information:  Patient ID: Alexandra Salinas, female DOB: 09/02/1975 Age: 46 y.o. MRN: [id number]   Alexandra Salinas is a pleasant 46 y.o. female presenting with the following:  Chief Complaint  Patient presents with   Headache    X4 days, Went to burn boot camp started having a headache, was nauseous, vomiting x1 time , comes and goes, dull constant headache, starts happening when she's at boot camp, when working out its all over then it goes to the right side of head and is constant, took advil relived pain a little    Vitals:   06/07/21 0804  BP: 108/78  Pulse: 66  SpO2: 95%   Vitals:   06/07/21 0804  Weight: 193 lb (87.5 kg)  Height: 5\' 6"  (1.676 m)   Body mass index is 31.15 kg/m.  No results found.   Independent interpretation of notes and tests performed by another provider:   None  Procedures performed:   None  Pertinent History, Exam, Impression, and Recommendations:   Tension headache 4-day history of bilateral temporal headache onset during boot camp workout routine, describes this as tightness, bandlike distribution, denies any radiation into the neck or upper extremities, no vision change.  She did have associated nausea, right ear pain, 1 episode of emesis.  This pain improved with ibuprofen dosed as needed.  Has not noted this with normal ADLs, denies any incapacitation, no photosensitivity, no paresthesias or other neurologic sequela.  She did have a recurrence of this when she performed upper body activities and .  Pain primarily right greater than left.  Examination today reveals cranials 2 through 12 without focal deficit, no JVD, she has preserved upper body strength, sensation intact, negative Spurling's, active range of motion does reveal asymmetric right torsion when compared to contralateral, this is limited by pain discomfort along the paraspinal cervical musculature, she  does have mild tenderness and positive spasm symmetrically about the upper trapezius and levator scapular region.  Given her physical exam findings, clinical history, preceding events for headache pattern, and reassuring neurologic exam, and findings otherwise, concern for musculoskeletal underlying etiology and tension type headache pattern.  I have advised patient of the same as well as treatment tailored around scheduled meloxicam for 2 weeks, as needed cyclobenzaprine, hold from upper body activities for 2 weeks then gradual return, and a home-based rehab program.  She will be returning as previously scheduled in 4 weeks, we can follow-up on this issue at that time.  Cervical paraspinal muscle spasm 4-day history of tension type headache pattern in the setting of noted bilateral cervical paraspinal spasm localized about the trapezius and levator scapulae.  Fortunately no sensorimotor deficits or positive Spurling's noted.  I have advised scheduled anti-inflammatories that were prescribed today in addition to as needed skeletal muscle relaxer.  Home-based rehab and follow-up in 4 weeks advised.  For recalcitrant symptomatology, x-ray to be ordered.   Orders & Medications Meds ordered this encounter  Medications   meloxicam (MOBIC) 15 MG tablet    Sig: Take 1 tablet (15 mg total) by mouth daily.    Dispense:  30 tablet    Refill:  0   cyclobenzaprine (FLEXERIL) 5 MG tablet    Sig: Take 1-2 tablets (5-10 mg total) by mouth at bedtime as needed for muscle spasms.    Dispense:  30 tablet    Refill:  0   No orders of the defined types  were placed in this encounter.    No follow-ups on file.     Jerrol Banana, MD   Primary Care Sports Medicine [facility]

## 2021-06-07 NOTE — Assessment & Plan Note (Signed)
4-day history of bilateral temporal headache onset during [employer] workout routine, describes this as tightness, bandlike distribution, denies any radiation into the neck or upper extremities, no vision change.  She did have associated nausea, right ear pain, 1 episode of emesis.  This pain improved with ibuprofen dosed as needed.  Has not noted this with normal ADLs, denies any incapacitation, no photosensitivity, no paresthesias or other neurologic sequela.  She did have a recurrence of this when she performed upper body activities and [employer].  Pain primarily right greater than left.  Examination today reveals cranials 2 through 12 without focal deficit, no JVD, she has preserved upper body strength, sensation intact, negative Spurling's, active range of motion does reveal asymmetric right torsion when compared to contralateral, this is limited by pain discomfort along the paraspinal cervical musculature, she does have mild tenderness and positive spasm symmetrically about the upper trapezius and levator scapular region.  Given her physical exam findings, clinical history, preceding events for headache pattern, and reassuring neurologic exam, and findings otherwise, concern for musculoskeletal underlying etiology and tension type headache pattern.  I have advised patient of the same as well as treatment tailored around scheduled meloxicam for 2 weeks, as needed cyclobenzaprine, hold from upper body activities for 2 weeks then gradual return, and a home-based rehab program.  She will be returning as previously scheduled in 4 weeks, we can follow-up on this issue at that time.

## 2021-06-08 ENCOUNTER — Other Ambulatory Visit: Payer: Self-pay | Admitting: Family Medicine

## 2021-06-08 DIAGNOSIS — E559 Vitamin D deficiency, unspecified: Secondary | ICD-10-CM

## 2021-06-08 NOTE — Telephone Encounter (Signed)
Requested medication (s) are due for refill today:   Provider to review  Requested medication (s) are on the active medication list:   Yes  Future visit scheduled:   Yes   Last ordered: 05/16/2021 #8, 0 refills  Non delegated refill   Requested Prescriptions  Pending Prescriptions Disp Refills   Vitamin D, Ergocalciferol, (DRISDOL) 1.25 MG (50000 UNIT) CAPS capsule [Pharmacy Med Name: VITAMIN D2 1.25MG (50,000 UNIT)] 4 capsule 1    Sig: Take 1 capsule (50,000 Units total) by mouth every 7 (seven) days. Take for 8 total doses(weeks)     Endocrinology:  Vitamins - Vitamin D Supplementation Failed - 06/08/2021  9:32 AM      Failed - 50,000 IU strengths are not delegated      Failed - Phosphate in normal range and within 360 days    No results found for: PHOS        Failed - Vitamin D in normal range and within 360 days    Vit D, 25-Hydroxy  Date Value Ref Range Status  05/12/2021 27.6 (L) 30.0 - 100.0 ng/mL Final    Comment:    Vitamin D deficiency has been defined by the Institute of Medicine and an Endocrine Society practice guideline as a level of serum 25-OH vitamin D less than 20 ng/mL (1,2). The Endocrine Society went on to further define vitamin D insufficiency as a level between 21 and 29 ng/mL (2). 1. IOM (Institute of Medicine). 2010. Dietary reference    intakes for calcium and D. [place]: The    [employer]. 2. Holick MF, Binkley Loomis, Bischoff-Ferrari HA, et al.    Evaluation, treatment, and prevention of vitamin D    deficiency: an Endocrine Society clinical practice    guideline. JCEM. 2011 Jul; 96(7):1911-30.           Passed - Ca in normal range and within 360 days    Calcium  Date Value Ref Range Status  05/12/2021 9.3 8.7 - 10.2 mg/dL Final          Passed - Valid encounter within last 12 months    Recent Outpatient Visits           Yesterday Tension headache   [facility] Jerrol Banana, MD   1 month ago Annual  physical exam   [facility] Jerrol Banana, MD   2 months ago Pain of both hip joints   [facility] Jerrol Banana, MD       Future Appointments             In 1 month Ashley Royalty, Ocie Bob, MD [facility], [facility]

## 2021-06-12 ENCOUNTER — Ambulatory Visit: Payer: BC Managed Care – PPO | Admitting: Family Medicine

## 2021-06-26 ENCOUNTER — Ambulatory Visit (INDEPENDENT_AMBULATORY_CARE_PROVIDER_SITE_OTHER): Payer: BC Managed Care – PPO | Admitting: Gastroenterology

## 2021-06-26 ENCOUNTER — Encounter: Payer: Self-pay | Admitting: Gastroenterology

## 2021-06-26 ENCOUNTER — Other Ambulatory Visit: Payer: Self-pay

## 2021-06-26 VITALS — BP 126/80 | HR 70 | Temp 98.2°F | Ht 64.5 in | Wt 195.0 lb

## 2021-06-26 DIAGNOSIS — K22719 Barrett's esophagus with dysplasia, unspecified: Secondary | ICD-10-CM

## 2021-06-26 NOTE — Progress Notes (Signed)
Gastroenterology Consultation  Referring Provider:     Jerrol Banana, MD Primary Care Physician:  Jerrol Banana, MD Primary Gastroenterologist:  Dr. Servando Snare     Reason for Consultation:     Barrett's esophagus        HPI:   Alexandra Salinas is a 46 y.o. y/o female referred for consultation & management of Barrett's esophagus by Dr. Ashley Royalty, Ocie Bob, MD. this patient comes to see me with a history of having a colonoscopy in 2020 and an upper endoscopy that same year.  The patient was told that she had Barrett's esophagus.  She also has a history of gastroesophageal reflux disease.  Her previous work-up was done at [facility] in [place]. The patient was told that she needed a repeat colonoscopy in 5 years and had her upper endoscopy in 2022 and was reported to have Barrett's esophagus at that time.  The patient comes in with a pathology report of the Barrett's which does show intestinal metaplasia.  She denies any dysphagia nausea vomiting fevers chills black stools or bloody stools.  She also denies any abdominal pain or unexplained weight loss.  Past Medical History:  Diagnosis Date   Anxiety    Depression    Diverticulitis    GERD (gastroesophageal reflux disease)     Past Surgical History:  Procedure Laterality Date   CESAREAN SECTION  2021   COLONOSCOPY  2020   ESOPHAGOGASTRODUODENOSCOPY  11/2020   REFRACTIVE SURGERY Bilateral 2015    Prior to Admission medications   Medication Sig Start Date End Date Taking? Authorizing Provider  busPIRone (BUSPAR) 5 MG tablet Take 5 mg by mouth 2 (two) times daily.    [provider]  cyclobenzaprine (FLEXERIL) 5 MG tablet Take 1-2 tablets (5-10 mg total) by mouth at bedtime as needed for muscle spasms. 06/07/21   Jerrol Banana, MD  escitalopram (LEXAPRO) 20 MG tablet Take 20 mg by mouth daily.    [provider]  famotidine (PEPCID) 20 MG tablet Take 20 mg by mouth 2 (two) times  daily.    [provider]  meloxicam (MOBIC) 15 MG tablet Take 1 tablet (15 mg total) by mouth daily. 06/07/21   Jerrol Banana, MD  Vitamin D, Ergocalciferol, (DRISDOL) 1.25 MG (50000 UNIT) CAPS capsule Take 1 capsule (50,000 Units total) by mouth every 7 (seven) days. Take for 8 total doses(weeks) 05/16/21   Jerrol Banana, MD    Family History  Problem Relation Age of Onset   Depression Mother    Kidney disease Mother    Hypertension Mother    Diabetes Mother    Vascular Disease Mother    Dementia Mother    Stroke Mother    Heart disease Father    Colon cancer Father    Hyperlipidemia Brother    Hypertension Brother    Depression Brother    Alcoholism Brother    Diabetes Maternal Grandmother    Heart disease Maternal Grandmother    Stroke Maternal Grandfather    Lung cancer Paternal Grandmother      Social History   Tobacco Use   Smoking status: Former    Packs/day: 1.00    Years: 20.00    Pack years: 20.00    Types: Cigarettes    Quit date: 2017    Years since quitting: 6.1   Smokeless tobacco: Never  Vaping Use   Vaping Use: Never used  Substance Use Topics  Alcohol use: Yes    Alcohol/week: 12.0 standard drinks    Types: 12 Standard drinks or equivalent per week   Drug use: Never    Allergies as of 06/26/2021   (No Known Allergies)    Review of Systems:    All systems reviewed and negative except where noted in HPI.   Physical Exam:  There were no vitals taken for this visit. No LMP recorded. General:   Alert,  Well-developed, well-nourished, pleasant and cooperative in NAD Head:  Normocephalic and atraumatic. Eyes:  Sclera clear, no icterus.   Conjunctiva pink. Ears:  Normal auditory acuity. Neck:  Supple; no masses or thyromegaly. Lungs:  Respirations even and unlabored.  Clear throughout to auscultation.   No wheezes, crackles, or rhonchi. No acute distress. Heart:  Regular rate and rhythm; no murmurs, clicks, rubs, or  gallops. Abdomen:  Normal bowel sounds.  No bruits.  Soft, non-tender and non-distended without masses, hepatosplenomegaly or hernias noted.  No guarding or rebound tenderness.  Negative Carnett sign.   Rectal:  Deferred.  Pulses:  Normal pulses noted. Extremities:  No clubbing or edema.  No cyanosis. Neurologic:  Alert and oriented x3;  grossly normal neurologically. Skin:  Intact without significant lesions or rashes.  No jaundice. Lymph Nodes:  No significant cervical adenopathy. Psych:  Alert and cooperative. Normal mood and affect.  Imaging Studies: No results found.  Assessment and Plan:   [name] is a 46 y.o. y/o female who comes in today with a history of Barrett's esophagus with a repeat upper endoscopy recommended in 2025.  The patient also had a colonoscopy in 2020 and and was reported to have a repeat requested in 2025.  The patient has been told that she will be set up for both procedures in 2025.  The patient will stay on her Pepcid since this has been controlling her symptoms with avoiding trigger foods.  The patient has been explained the plan and agrees with it.    Midge Minium, MD. Clementeen Graham    Note: This dictation was prepared with Dragon dictation along with smaller phrase technology. Any transcriptional errors that result from this process are unintentional.

## 2021-07-05 ENCOUNTER — Other Ambulatory Visit: Payer: Self-pay | Admitting: Family Medicine

## 2021-07-05 DIAGNOSIS — M62838 Other muscle spasm: Secondary | ICD-10-CM

## 2021-07-05 DIAGNOSIS — G44209 Tension-type headache, unspecified, not intractable: Secondary | ICD-10-CM

## 2021-07-05 NOTE — Telephone Encounter (Signed)
D/C 06/26/21. Requested Prescriptions  Refused Prescriptions Disp Refills   meloxicam (MOBIC) 15 MG tablet [Pharmacy Med Name: MELOXICAM 15 MG TABLET] 30 tablet 0    Sig: TAKE 1 TABLET (15 MG TOTAL) BY MOUTH DAILY.     Analgesics:  COX2 Inhibitors Failed - 07/05/2021  2:51 AM      Failed - Manual Review: Labs are only required if the patient has taken medication for more than 8 weeks.      Passed - HGB in normal range and within 360 days    Hemoglobin  Date Value Ref Range Status  05/12/2021 13.3 11.1 - 15.9 g/dL Final         Passed - Cr in normal range and within 360 days    Creatinine, Ser  Date Value Ref Range Status  05/12/2021 0.81 0.57 - 1.00 mg/dL Final         Passed - HCT in normal range and within 360 days    Hematocrit  Date Value Ref Range Status  05/12/2021 41.1 34.0 - 46.6 % Final         Passed - AST in normal range and within 360 days    AST  Date Value Ref Range Status  05/12/2021 24 0 - 40 IU/L Final         Passed - ALT in normal range and within 360 days    ALT  Date Value Ref Range Status  05/12/2021 18 0 - 32 IU/L Final         Passed - eGFR is 30 or above and within 360 days    eGFR  Date Value Ref Range Status  05/12/2021 90 >59 mL/min/1.73 Final         Passed - Patient is not pregnant      Passed - Valid encounter within last 12 months    Recent Outpatient Visits          4 weeks ago Tension headache   [facility] [name], MD   1 month ago Annual physical exam   [facility] [name], MD   3 months ago Pain of both hip joints   [facility] [name], MD      Future Appointments            In 5 days Zigmund Daniel, Earley Abide, MD [facility], [place]

## 2021-07-07 ENCOUNTER — Other Ambulatory Visit: Payer: Self-pay | Admitting: Family Medicine

## 2021-07-07 DIAGNOSIS — E559 Vitamin D deficiency, unspecified: Secondary | ICD-10-CM

## 2021-07-08 NOTE — Telephone Encounter (Signed)
Requested medication (s) are due for refill today: yes  Requested medication (s) are on the active medication list: yes  Last refill:  05/16/21 #8 with 0 RF  Future visit scheduled: 07/10/21  Notes to clinic:  There is no protocol to follow, please assess.      Requested Prescriptions  Pending Prescriptions Disp Refills   Vitamin D, Ergocalciferol, (DRISDOL) 1.25 MG (50000 UNIT) CAPS capsule [Pharmacy Med Name: VITAMIN D2 1.25MG (50,000 UNIT)] 4 capsule 1    Sig: Take 1 capsule (50,000 Units total) by mouth every 7 (seven) days. Take for 8 total doses(weeks)     Endocrinology:  Vitamins - Vitamin D Supplementation 2 Failed - 07/07/2021  2:35 PM      Failed - Manual Review: Route requests for 50,000 IU strength to the provider      Failed - Vitamin D in normal range and within 360 days    Vit D, 25-Hydroxy  Date Value Ref Range Status  05/12/2021 27.6 (L) 30.0 - 100.0 ng/mL Final    Comment:    Vitamin D deficiency has been defined by the Institute of Medicine and an Endocrine Society practice guideline as a level of serum 25-OH vitamin D less than 20 ng/mL (1,2). The Endocrine Society went on to further define vitamin D insufficiency as a level between 21 and 29 ng/mL (2). 1. IOM (Institute of Medicine). 2010. Dietary reference    intakes for calcium and D. [place]: The    [employer]. 2. Holick MF, Binkley [place], Bischoff-Ferrari HA, et al.    Evaluation, treatment, and prevention of vitamin D    deficiency: an Endocrine Society clinical practice    guideline. JCEM. 2011 Jul; 96(7):1911-30.           Passed - Ca in normal range and within 360 days    Calcium  Date Value Ref Range Status  05/12/2021 9.3 8.7 - 10.2 mg/dL Final          Passed - Valid encounter within last 12 months    Recent Outpatient Visits           1 month ago Tension headache   [facility] Jerrol Banana, MD   2 months ago Annual physical exam   [facility] Jerrol Banana, MD   3 months ago Pain of both hip joints   [facility] Jerrol Banana, MD       Future Appointments             In 2 days Ashley Royalty Ocie Bob, MD [facility], [facility]

## 2021-07-10 ENCOUNTER — Encounter: Payer: Self-pay | Admitting: Family Medicine

## 2021-07-10 ENCOUNTER — Ambulatory Visit (INDEPENDENT_AMBULATORY_CARE_PROVIDER_SITE_OTHER): Payer: BC Managed Care – PPO | Admitting: Family Medicine

## 2021-07-10 ENCOUNTER — Other Ambulatory Visit: Payer: Self-pay

## 2021-07-10 VITALS — BP 130/80 | HR 68 | Ht 64.5 in | Wt 192.0 lb

## 2021-07-10 DIAGNOSIS — M62838 Other muscle spasm: Secondary | ICD-10-CM

## 2021-07-10 DIAGNOSIS — G44209 Tension-type headache, unspecified, not intractable: Secondary | ICD-10-CM

## 2021-07-10 NOTE — Assessment & Plan Note (Signed)
Patient presents for follow-up to bilateral temporal headache noted during a [employer] workout routine, at her last visit on 06/07/2021, she was found to have asymmetric right paraspinal cervical spasm and findings most consistent with tension type headache pattern.  We did a 2-week course of meloxicam, as needed cyclobenzaprine, relative rest x2 weeks, home-based rehab, and allowed close follow-up.  Patient states that she has noted excellent interval improvement though not resolution of her stated symptoms.  She denies any further nausea, does have headache with overhead activities, some newly noted mild right shoulder pain.  She has been back to her usual workout routine without significant issues otherwise.  Musculoskeletal components discussed elsewhere.  I did review her symptoms at length, these are primarily noted when she attends workout classes without food or drink prior.  I have encouraged her to focus on the dietary component prior to her workouts, additional MSK components reviewed, and to reach out to [place] if symptoms persist despite dietary and MSK interventions.  She can follow-up on as-needed basis for this issue.

## 2021-07-10 NOTE — Progress Notes (Signed)
°  ° °  Primary Care / Sports Medicine Office Visit  Patient Information:  Patient ID: Alexandra Salinas, female DOB: 10-Sep-1975 Age: 46 y.o. MRN: [id number]   Alexandra Salinas is a pleasant 46 y.o. female presenting with the following:  Chief Complaint  Patient presents with   Follow-up   Cervical paraspinal muscle spasm    Better; headaches have lessened; denies pain in office    Vitals:   07/10/21 0826  BP: 130/80  Pulse: 68  SpO2: 97%   Vitals:   07/10/21 0826  Weight: 192 lb (87.1 kg)  Height: 5' 4.5" (1.638 m)   Body mass index is 32.45 kg/m.  No results found.   Independent interpretation of notes and tests performed by another provider:   None  Procedures performed:   None  Pertinent History, Exam, Impression, and Recommendations:   Tension headache Patient presents for follow-up to bilateral temporal headache noted during a [employer] workout routine, at her last visit on 06/07/2021, she was found to have asymmetric right paraspinal cervical spasm and findings most consistent with tension type headache pattern.  We did a 2-week course of meloxicam, as needed cyclobenzaprine, relative rest x2 weeks, home-based rehab, and allowed close follow-up.  Patient states that she has noted excellent interval improvement though not resolution of her stated symptoms.  She denies any further nausea, does have headache with overhead activities, some newly noted mild right shoulder pain.  She has been back to her usual workout routine without significant issues otherwise.  Musculoskeletal components discussed elsewhere.  I did review her symptoms at length, these are primarily noted when she attends workout classes without food or drink prior.  I have encouraged her to focus on the dietary component prior to her workouts, additional MSK components reviewed, and to reach out to [place] if symptoms persist despite dietary and MSK interventions.  She can follow-up on as-needed  basis for this issue.  Cervical paraspinal muscle spasm Patient presents for follow-up to right cervical paraspinal muscle spasm in the setting of tension type headache discussed at her last visit on 06/07/2021.  At the time recommendation was for scheduled meloxicam, as needed cyclobenzaprine, home exercises, and follow-up.  She has noted near resolution of her symptoms.  Examination today does show persistent, interval improved, focality to the right levator scapula, additionally her shoulder exam reveals 5/5 strength painful isolated supraspinatus testing, negative impingement testing and benign testing otherwise.  Given her persistent symptomatology, though interval improved, I have advised continued home exercises, OTC medications for pain, and recommendation for formal physical therapy to oversee a primarily home-based rehab program.  She is amenable to considering these options advised to contact her office over the next 6 weeks if symptoms fail to improve.  If that is the case, x-rays of the cervical spine and/or right shoulder to be obtained and follow-up scheduled.   Orders & Medications No orders of the defined types were placed in this encounter.  Orders Placed This Encounter  Procedures   Ambulatory referral to Physical Therapy     Return in about 10 months (around 05/09/2022).     Jerrol Banana, MD   Primary Care Sports Medicine [facility]

## 2021-07-10 NOTE — Assessment & Plan Note (Signed)
Patient presents for follow-up to right cervical paraspinal muscle spasm in the setting of tension type headache discussed at her last visit on 06/07/2021.  At the time recommendation was for scheduled meloxicam, as needed cyclobenzaprine, home exercises, and follow-up.  She has noted near resolution of her symptoms.  Examination today does show persistent, interval improved, focality to the right levator scapula, additionally her shoulder exam reveals 5/5 strength painful isolated supraspinatus testing, negative impingement testing and benign testing otherwise.  Given her persistent symptomatology, though interval improved, I have advised continued home exercises, OTC medications for pain, and recommendation for formal physical therapy to oversee a primarily home-based rehab program.  She is amenable to considering these options advised to contact her office over the next 6 weeks if symptoms fail to improve.  If that is the case, x-rays of the cervical spine and/or right shoulder to be obtained and follow-up scheduled.

## 2021-09-13 ENCOUNTER — Ambulatory Visit (INDEPENDENT_AMBULATORY_CARE_PROVIDER_SITE_OTHER): Payer: BC Managed Care – PPO | Admitting: Family Medicine

## 2021-09-13 ENCOUNTER — Encounter: Payer: Self-pay | Admitting: Family Medicine

## 2021-09-13 VITALS — BP 130/82 | HR 74 | Ht 64.0 in | Wt 194.8 lb

## 2021-09-13 DIAGNOSIS — R42 Dizziness and giddiness: Secondary | ICD-10-CM | POA: Insufficient documentation

## 2021-09-13 MED ORDER — MECLIZINE HCL 25 MG PO TABS: 25.0000 mg | ORAL_TABLET | Freq: Three times a day (TID) | ORAL | 0 refills | Status: DC | PRN

## 2021-09-13 NOTE — Assessment & Plan Note (Addendum)
Patient with several day history of mild dizziness described as lightheadedness/room spinning, not necessarily tied to change in position, more so with prolonged screen time (working on computer). Noted after a trip to [place]. She does describe some short-lived lateral neck pain, Examination shows benign HEENT examination, minor pupillary focus / shift after cover/uncover and horizontal nystagmus but otherwise benign cranial nerve testing. Plan for PRN meclizine, increase hydration, home neck exercises, seeing optometrist for vision check, and to contact us in 1 month if symptoms persist. ?

## 2021-09-13 NOTE — Progress Notes (Signed)
?  ? ?  Primary Care / Sports Medicine Office Visit ? ?Patient Information:  ?Patient ID: Aleira Deiter, female DOB: [birth year] Age: 46 y.o. MRN: [id number]  ? ?Raiyah Speakman is a pleasant 46 y.o. female presenting with the following: ? ?Chief Complaint  ?Patient presents with  ? Dizziness  ?  For 5 days off an on, only when pt is up and moving, never lying down. No medication   ? ? ?Vitals:  ? 09/13/21 1038  ?BP: 130/82  ?Pulse: 74  ?SpO2: 98%  ? ?Vitals:  ? 09/13/21 1038  ?Weight: 194 lb 12.8 oz (88.4 kg)  ?Height: 5\' 4"  (1.626 m)  ? ?Body mass index is 33.44 kg/m?. ? ?No results found.  ? ?Independent interpretation of notes and tests performed by another provider:  ? ?None ? ?Procedures performed:  ? ?None ? ?Pertinent History, Exam, Impression, and Recommendations:  ? ?Problem List Items Addressed This Visit   ? ?  ? Other  ? Vertigo - Primary  ?  Patient with several day history of mild dizziness described as lightheadedness/room spinning, not necessarily tied to change in position, more so with prolonged screen time (working on computer). Noted after a trip to . She does describe some short-lived lateral neck pain, Examination shows benign HEENT examination, minor pupillary focus / shift after cover/uncover and horizontal nystagmus but otherwise benign cranial nerve testing. Plan for PRN meclizine, increase hydration, home neck exercises, seeing optometrist for vision check, and to contact [place] in 1 month if symptoms persist. ? ?  ?  ? Relevant Medications  ? meclizine (ANTIVERT) 25 MG tablet  ?  ? ?Orders & Medications ?Meds ordered this encounter  ?Medications  ? meclizine (ANTIVERT) 25 MG tablet  ?  Sig: Take 1 tablet (25 mg total) by mouth 3 (three) times daily as needed for dizziness.  ?  Dispense:  30 tablet  ?  Refill:  0  ? ?No orders of the defined types were placed in this encounter. ?  ? ?No follow-ups on file.  ?  ? ?[place], MD ? ? Primary Care Sports  Medicine ?[facility] ?[facility]  ? ?

## 2021-10-19 HISTORY — PX: BASAL CELL CARCINOMA EXCISION: SHX1214

## 2021-10-24 DIAGNOSIS — C44319 Basal cell carcinoma of skin of other parts of face: Secondary | ICD-10-CM | POA: Diagnosis not present

## 2021-10-24 DIAGNOSIS — D225 Melanocytic nevi of trunk: Secondary | ICD-10-CM | POA: Diagnosis not present

## 2021-10-24 DIAGNOSIS — D485 Neoplasm of uncertain behavior of skin: Secondary | ICD-10-CM | POA: Diagnosis not present

## 2021-10-24 DIAGNOSIS — L578 Other skin changes due to chronic exposure to nonionizing radiation: Secondary | ICD-10-CM | POA: Diagnosis not present

## 2021-11-06 DIAGNOSIS — L814 Other melanin hyperpigmentation: Secondary | ICD-10-CM | POA: Diagnosis not present

## 2021-11-06 DIAGNOSIS — C44319 Basal cell carcinoma of skin of other parts of face: Secondary | ICD-10-CM | POA: Diagnosis not present

## 2021-11-06 DIAGNOSIS — L578 Other skin changes due to chronic exposure to nonionizing radiation: Secondary | ICD-10-CM | POA: Diagnosis not present

## 2021-11-06 DIAGNOSIS — L988 Other specified disorders of the skin and subcutaneous tissue: Secondary | ICD-10-CM | POA: Diagnosis not present

## 2022-02-28 ENCOUNTER — Encounter: Payer: Self-pay | Admitting: Family Medicine

## 2022-02-28 NOTE — Telephone Encounter (Signed)
Please advise 

## 2022-04-25 DIAGNOSIS — Z85828 Personal history of other malignant neoplasm of skin: Secondary | ICD-10-CM | POA: Diagnosis not present

## 2022-04-25 DIAGNOSIS — L578 Other skin changes due to chronic exposure to nonionizing radiation: Secondary | ICD-10-CM | POA: Diagnosis not present

## 2022-04-25 DIAGNOSIS — D225 Melanocytic nevi of trunk: Secondary | ICD-10-CM | POA: Diagnosis not present

## 2022-05-09 ENCOUNTER — Other Ambulatory Visit: Payer: Self-pay

## 2022-05-09 ENCOUNTER — Encounter: Payer: BC Managed Care – PPO | Admitting: Family Medicine

## 2022-05-09 ENCOUNTER — Encounter: Payer: Self-pay | Admitting: Family Medicine

## 2022-05-09 DIAGNOSIS — E559 Vitamin D deficiency, unspecified: Secondary | ICD-10-CM | POA: Diagnosis not present

## 2022-05-09 DIAGNOSIS — Z13220 Encounter for screening for lipoid disorders: Secondary | ICD-10-CM

## 2022-05-09 DIAGNOSIS — Z0000 Encounter for general adult medical examination without abnormal findings: Secondary | ICD-10-CM

## 2022-05-10 LAB — LIPID PANEL
Chol/HDL Ratio: 4.4 ratio (ref 0.0–4.4)
Cholesterol, Total: 214 mg/dL — ABNORMAL HIGH (ref 100–199)
HDL: 49 mg/dL (ref >39)
LDL Chol Calc (NIH): 128 mg/dL — ABNORMAL HIGH (ref 0–99)
Triglycerides: 207 mg/dL — ABNORMAL HIGH (ref 0–149)
VLDL Cholesterol Cal: 37 mg/dL (ref 5–40)

## 2022-05-10 LAB — CBC
Hematocrit: 40.6 % (ref 34.0–46.6)
Hemoglobin: 13.5 g/dL (ref 11.1–15.9)
MCH: 29.2 pg (ref 26.6–33.0)
MCHC: 33.3 g/dL (ref 31.5–35.7)
MCV: 88 fL (ref 79–97)
Platelets: 144 10*3/uL — ABNORMAL LOW (ref 150–450)
RBC: 4.62 x10E6/uL (ref 3.77–5.28)
RDW: 12.5 % (ref 11.7–15.4)
WBC: 8.6 10*3/uL (ref 3.4–10.8)

## 2022-05-10 LAB — COMPREHENSIVE METABOLIC PANEL
ALT: 27 IU/L (ref 0–32)
AST: 24 IU/L (ref 0–40)
Albumin/Globulin Ratio: 1.7 (ref 1.2–2.2)
Albumin: 4.5 g/dL (ref 3.9–4.9)
Alkaline Phosphatase: 64 IU/L (ref 44–121)
BUN/Creatinine Ratio: 18 (ref 9–23)
BUN: 16 mg/dL (ref 6–24)
Bilirubin Total: 0.3 mg/dL (ref 0.0–1.2)
CO2: 21 mmol/L (ref 20–29)
Calcium: 9.4 mg/dL (ref 8.7–10.2)
Chloride: 104 mmol/L (ref 96–106)
Creatinine, Ser: 0.89 mg/dL (ref 0.57–1.00)
Globulin, Total: 2.6 g/dL (ref 1.5–4.5)
Glucose: 105 mg/dL — ABNORMAL HIGH (ref 70–99)
Potassium: 4.6 mmol/L (ref 3.5–5.2)
Sodium: 138 mmol/L (ref 134–144)
Total Protein: 7.1 g/dL (ref 6.0–8.5)
eGFR: 80 mL/min/{1.73_m2} (ref >59)

## 2022-05-10 LAB — TSH: TSH: 2.07 u[IU]/mL (ref 0.450–4.500)

## 2022-05-10 LAB — VITAMIN D 25 HYDROXY (VIT D DEFICIENCY, FRACTURES): Vit D, 25-Hydroxy: 24.3 ng/mL — ABNORMAL LOW (ref 30.0–100.0)

## 2022-05-10 NOTE — Progress Notes (Signed)
Erroneous encounter

## 2022-05-11 ENCOUNTER — Ambulatory Visit (INDEPENDENT_AMBULATORY_CARE_PROVIDER_SITE_OTHER): Payer: BC Managed Care – PPO | Admitting: Family Medicine

## 2022-05-11 ENCOUNTER — Encounter: Payer: Self-pay | Admitting: Family Medicine

## 2022-05-11 VITALS — BP 128/78 | HR 64 | Ht 64.0 in | Wt 204.0 lb

## 2022-05-11 DIAGNOSIS — Z0000 Encounter for general adult medical examination without abnormal findings: Secondary | ICD-10-CM

## 2022-05-11 DIAGNOSIS — R7989 Other specified abnormal findings of blood chemistry: Secondary | ICD-10-CM | POA: Diagnosis not present

## 2022-05-11 MED ORDER — VITAMIN D (ERGOCALCIFEROL) 1.25 MG (50000 UNIT) PO CAPS: 50000.0000 [IU] | ORAL_CAPSULE | ORAL | 0 refills | Status: DC

## 2022-05-11 NOTE — Patient Instructions (Addendum)
-   Review information provided - Attend eye doctor annually, dentist every 6 months, work towards or maintain 30 minutes of moderate intensity physical activity at least 5 days per week, and consume a balanced diet - Start Rx weekly vitamin D course - After course use OTC vitamin D [phone] IU daily - Home Sleep Study ordered, someone will contact you - Can review medications below and check with insurance if interested in starting - Return in 1 year for physical - Contact us for any questions between now and then

## 2022-05-21 DIAGNOSIS — R7989 Other specified abnormal findings of blood chemistry: Secondary | ICD-10-CM | POA: Insufficient documentation

## 2022-05-21 NOTE — Assessment & Plan Note (Signed)
Repeat labs ordered

## 2022-05-21 NOTE — Progress Notes (Signed)
Annual Physical Exam Visit  Patient Information:  Patient ID: Alexandra Salinas, female DOB: 03-Nov-1975 Age: 47 y.o. MRN: [id number]   Subjective:   CC: Annual Physical Exam  HPI:  Alexandra Salinas is here for their annual physical.  I reviewed the past medical history, family history, social history, surgical history, and allergies today and changes were made as necessary.  Please see the problem list section below for additional details.  Past Medical History: Past Medical History:  Diagnosis Date   Anxiety    Depression    Diverticulitis    GERD (gastroesophageal reflux disease)    Past Surgical History: Past Surgical History:  Procedure Laterality Date   CESAREAN SECTION  2021   COLONOSCOPY  2020   ESOPHAGOGASTRODUODENOSCOPY  11/2020   REFRACTIVE SURGERY Bilateral 2015   Family History: Family History  Problem Relation Age of Onset   Depression Mother    Kidney disease Mother    Hypertension Mother    Diabetes Mother    Vascular Disease Mother    Dementia Mother    Stroke Mother    Heart disease Father    Colon cancer Father    Hyperlipidemia Brother    Hypertension Brother    Depression Brother    Alcoholism Brother    Diabetes Maternal Grandmother    Heart disease Maternal Grandmother    Stroke Maternal Grandfather    Lung cancer Paternal Grandmother    Allergies: No Known Allergies Health Maintenance: Health Maintenance  Topic Date Due   COVID-19 Vaccine (1) 05/27/2022 (Originally 01/18/1981)   INFLUENZA VACCINE  08/19/2022 (Originally 12/19/2021)   PAP SMEAR-Modifier  05/02/2024   COLONOSCOPY (Pts 45-24yrs Insurance coverage will need to be confirmed)  05/21/2028   Hepatitis C Screening  Completed   HIV Screening  Completed   HPV VACCINES  Aged Out   DTaP/Tdap/Td  Discontinued    HM Colonoscopy          COLONOSCOPY (Pts 45-85yrs Insurance coverage will need to be confirmed) (Every 10 Years) Next due on 05/21/2028    05/21/2018   Done - [place]           Medications: Current Outpatient Medications on File Prior to Visit  Medication Sig Dispense Refill   busPIRone (BUSPAR) 5 MG tablet Take 5 mg by mouth 2 (two) times daily.     escitalopram (LEXAPRO) 20 MG tablet Take 20 mg by mouth daily.     famotidine (PEPCID) 20 MG tablet Take 20 mg by mouth 2 (two) times daily.     No current facility-administered medications on file prior to visit.    Review of Systems: No headache, visual changes, nausea, vomiting, diarrhea, constipation, dizziness, abdominal pain, skin rash, fevers, chills, night sweats, swollen lymph nodes, weight loss, chest pain, body aches, joint swelling, muscle aches, shortness of breath, mood changes, visual or auditory hallucinations reported.  Objective:   Vitals:   05/11/22 0903  BP: 128/78  Pulse: 64  SpO2: 98%   Vitals:   05/11/22 0903  Weight: 204 lb (92.5 kg)  Height: 5\' 4"  (1.626 m)   Body mass index is 35.02 kg/m.  General: Well Developed, well nourished, and in no acute distress.  Neuro: Alert and oriented x3, extra-ocular muscles intact, sensation grossly intact. Cranial nerves II through XII are grossly intact, motor, sensory, and coordinative functions are intact. HEENT: Normocephalic, atraumatic, pupils equal round reactive to light, neck supple, no masses, no lymphadenopathy, thyroid nonpalpable. Oropharynx, nasopharynx, external ear canals  are unremarkable. Skin: Warm and dry, no rashes noted.  Cardiac: Regular rate and rhythm, no murmurs rubs or gallops. No peripheral edema. Pulses symmetric. Respiratory: Clear to auscultation bilaterally. Not using accessory muscles, speaking in full sentences.  Abdominal: Soft, nontender, nondistended, positive bowel sounds, no masses, no organomegaly. Musculoskeletal: Shoulder, elbow, wrist, hip, knee, ankle stable, and with full range of motion.  Female chaperone initials: KG present throughout the physical examination.  Impression  and Recommendations:   The patient was counselled, risk factors were discussed, and anticipatory guidance given.  Problem List Items Addressed This Visit       Other   Annual physical exam - Primary    Annual examination completed, risk stratification labs ordered, anticipatory guidance provided.  We will follow labs once resulted.      Low serum vitamin D    Repeat labs ordered.        Orders & Medications Medications:  Meds ordered this encounter  Medications   Vitamin D, Ergocalciferol, (DRISDOL) 1.25 MG (50000 UNIT) CAPS capsule    Sig: Take 1 capsule (50,000 Units total) by mouth every 7 (seven) days. Take for 8 total doses(weeks)    Dispense:  8 capsule    Refill:  0   No orders of the defined types were placed in this encounter.    Return in about 1 year (around 05/12/2023) for CPE.    [name], MD, [facility]   Primary Care Sports Medicine Primary Care and Sports Medicine at [facility]

## 2022-05-21 NOTE — Assessment & Plan Note (Signed)
Annual examination completed, risk stratification labs ordered, anticipatory guidance provided.  We will follow labs once resulted. 

## 2022-05-22 ENCOUNTER — Encounter: Payer: Self-pay | Admitting: Family Medicine

## 2022-05-22 ENCOUNTER — Other Ambulatory Visit: Payer: Self-pay

## 2022-05-22 DIAGNOSIS — R7309 Other abnormal glucose: Secondary | ICD-10-CM

## 2022-05-31 ENCOUNTER — Other Ambulatory Visit: Payer: Self-pay | Admitting: Family Medicine

## 2022-05-31 NOTE — Telephone Encounter (Signed)
Requested medication (s) are due for refill today: routing for approval  Requested medication (s) are on the active medication list: yes  Last refill:  05/11/22  Future visit scheduled: yes  Notes to clinic:  Manual Review: Route requests for 50,000 IU strength to the provider      Requested Prescriptions  Pending Prescriptions Disp Refills   Vitamin D, Ergocalciferol, (DRISDOL) 1.25 MG (50000 UNIT) CAPS capsule [Pharmacy Med Name: VITAMIN D2 1.25MG (50,000 UNIT)] 12 capsule 1    Sig: Take 1 capsule (50,000 Units total) by mouth every 7 (seven) days. Take for 8 total doses(weeks)     Endocrinology:  Vitamins - Vitamin D Supplementation 2 Failed - 05/31/2022 10:31 AM      Failed - Manual Review: Route requests for 50,000 IU strength to the provider      Failed - Vitamin D in normal range and within 360 days    Vit D, 25-Hydroxy  Date Value Ref Range Status  05/09/2022 24.3 (L) 30.0 - 100.0 ng/mL Final    Comment:    Vitamin D deficiency has been defined by the [place] practice guideline as a level of serum 25-OH vitamin D less than 20 ng/mL (1,2). The Endocrine Society went on to further define vitamin D insufficiency as a level between 21 and 29 ng/mL (2). 1. IOM (Institute of Medicine). 2010. Dietary reference    intakes for calcium and D. [place]: The    [employer]. 2. Holick MF, [place], Bischoff-Ferrari HA, et al.    Evaluation, treatment, and prevention of vitamin D    deficiency: an Endocrine Society clinical practice    guideline. JCEM. 2011 Jul; 96(7):1911-30.          Passed - Ca in normal range and within 360 days    Calcium  Date Value Ref Range Status  05/09/2022 9.4 8.7 - 10.2 mg/dL Final         Passed - Valid encounter within last 12 months    Recent Outpatient Visits           2 weeks ago Annual physical exam   [place] [facility] at [place], Earley Abide, MD   8 months ago Vertigo   [place] [facility] at [place], Earley Abide, MD   10 months ago Cervical paraspinal muscle spasm   [place] [facility] at [facility], Earley Abide, MD   11 months ago Tension headache    Primary Care and Sports Medicine at [facility], Earley Abide, MD   1 year ago Annual physical exam   [facility] Care and Sports Medicine at [facility], Earley Abide, MD       Future Appointments             In 11 months Zigmund Daniel, Earley Abide, MD [place] at [facility], [facility]

## 2022-06-04 ENCOUNTER — Telehealth: Payer: BC Managed Care – PPO | Admitting: Nurse Practitioner

## 2022-06-04 DIAGNOSIS — J0140 Acute pansinusitis, unspecified: Secondary | ICD-10-CM | POA: Diagnosis not present

## 2022-06-04 DIAGNOSIS — J40 Bronchitis, not specified as acute or chronic: Secondary | ICD-10-CM

## 2022-06-04 MED ORDER — DOXYCYCLINE HYCLATE 100 MG PO TABS: 100.0000 mg | ORAL_TABLET | Freq: Two times a day (BID) | ORAL | 0 refills | Status: AC

## 2022-06-04 MED ORDER — PREDNISONE 20 MG PO TABS: 20.0000 mg | ORAL_TABLET | Freq: Two times a day (BID) | ORAL | 0 refills | Status: AC

## 2022-06-04 NOTE — Progress Notes (Signed)
We are sorry that you are not feeling well.  Here is how we plan to help!  Based on your presentation I believe you most likely have A cough due to bacteria.  When patients have a fever and a productive cough with a change in color or increased sputum production, we are concerned about bacterial bronchitis.  If left untreated it can progress to pneumonia.  If your symptoms do not improve with your treatment plan it is important that you contact your provider.   I have prescribed Doxycycline 100 mg twice a day for 10 days. This will also cover a sinus infection.      In addition you may use A non-prescription cough medication called Mucinex DM: take 2 tablets every 12 hours. This will help with cough and sinus congestion   Prednisone 20 mg twice daily for 5 days   From your responses in the eVisit questionnaire you describe inflammation in the upper respiratory tract which is causing a significant cough.  This is commonly called Bronchitis and has four common causes:   Allergies Viral Infections Acid Reflux Bacterial Infection Allergies, viruses and acid reflux are treated by controlling symptoms or eliminating the cause. An example might be a cough caused by taking certain blood pressure medications. You stop the cough by changing the medication. Another example might be a cough caused by acid reflux. Controlling the reflux helps control the cough.  USE OF BRONCHODILATOR ("RESCUE") INHALERS: There is a risk from using your bronchodilator too frequently.  The risk is that over-reliance on a medication which only relaxes the muscles surrounding the breathing tubes can reduce the effectiveness of medications prescribed to reduce swelling and congestion of the tubes themselves.  Although you feel brief relief from the bronchodilator inhaler, your asthma may actually be worsening with the tubes becoming more swollen and filled with mucus.  This can delay other crucial treatments, such as oral steroid  medications. If you need to use a bronchodilator inhaler daily, several times per day, you should discuss this with your provider.  There are probably better treatments that could be used to keep your asthma under control.     HOME CARE Only take medications as instructed by your medical team. Complete the entire course of an antibiotic. Drink plenty of fluids and get plenty of rest. Avoid close contacts especially the very young and the elderly Cover your mouth if you cough or cough into your sleeve. Always remember to wash your hands A steam or ultrasonic humidifier can help congestion.   GET HELP RIGHT AWAY IF: You develop worsening fever. You become short of breath You cough up blood. Your symptoms persist after you have completed your treatment plan MAKE SURE YOU  Understand these instructions. Will watch your condition. Will get help right away if you are not doing well or get worse.    Thank you for choosing an e-visit.  Your e-visit answers were reviewed by a board certified advanced clinical practitioner to complete your personal care plan. Depending upon the condition, your plan could have included both over the counter or prescription medications.  Please review your pharmacy choice. Make sure the pharmacy is open so you can pick up prescription now. If there is a problem, you may contact your provider through [employer] and have the prescription routed to another pharmacy.  Your safety is important to [place]. If you have drug allergies check your prescription carefully.   For the next 24 hours you can use MyChart  to ask questions about today's visit, request a non-urgent call back, or ask for a work or school excuse. You will get an email in the next two days asking about your experience. I hope that your e-visit has been valuable and will speed your recovery.   Meds ordered this encounter  Medications   doxycycline (VIBRA-TABS) 100 MG tablet    Sig: Take 1 tablet  (100 mg total) by mouth 2 (two) times daily for 10 days.    Dispense:  20 tablet    Refill:  0   predniSONE (DELTASONE) 20 MG tablet    Sig: Take 1 tablet (20 mg total) by mouth 2 (two) times daily with a meal for 5 days.    Dispense:  10 tablet    Refill:  0    I spent approximately 5 minutes reviewing the patient's history, current symptoms and coordinating their care today.

## 2022-06-12 ENCOUNTER — Other Ambulatory Visit (HOSPITAL_COMMUNITY)
Admission: RE | Admit: 2022-06-12 | Discharge: 2022-06-12 | Disposition: A | Discharge status: Home / Self Care | Payer: BC Managed Care – PPO | Source: Ambulatory Visit | Attending: Family Medicine | Admitting: Family Medicine

## 2022-06-12 ENCOUNTER — Ambulatory Visit
Admission: RE | Admit: 2022-06-12 | Discharge: 2022-06-12 | Disposition: A | Discharge status: Home / Self Care | Payer: BC Managed Care – PPO | Source: Ambulatory Visit | Attending: Family Medicine | Admitting: Family Medicine

## 2022-06-12 ENCOUNTER — Encounter: Payer: Self-pay | Admitting: Family Medicine

## 2022-06-12 ENCOUNTER — Ambulatory Visit (INDEPENDENT_AMBULATORY_CARE_PROVIDER_SITE_OTHER): Payer: BC Managed Care – PPO | Admitting: Family Medicine

## 2022-06-12 ENCOUNTER — Ambulatory Visit
Admission: RE | Admit: 2022-06-12 | Discharge: 2022-06-12 | Disposition: A | Discharge status: Home / Self Care | Payer: BC Managed Care – PPO | Attending: Family Medicine | Admitting: Family Medicine

## 2022-06-12 VITALS — BP 126/78 | HR 70 | Ht 64.0 in | Wt 204.0 lb

## 2022-06-12 DIAGNOSIS — R300 Dysuria: Secondary | ICD-10-CM | POA: Insufficient documentation

## 2022-06-12 DIAGNOSIS — G5602 Carpal tunnel syndrome, left upper limb: Secondary | ICD-10-CM | POA: Insufficient documentation

## 2022-06-12 DIAGNOSIS — R058 Other specified cough: Secondary | ICD-10-CM | POA: Diagnosis not present

## 2022-06-12 DIAGNOSIS — R062 Wheezing: Secondary | ICD-10-CM | POA: Diagnosis not present

## 2022-06-12 DIAGNOSIS — R7309 Other abnormal glucose: Secondary | ICD-10-CM | POA: Diagnosis not present

## 2022-06-12 DIAGNOSIS — R32 Unspecified urinary incontinence: Secondary | ICD-10-CM | POA: Diagnosis not present

## 2022-06-12 DIAGNOSIS — R059 Cough, unspecified: Secondary | ICD-10-CM | POA: Diagnosis not present

## 2022-06-12 DIAGNOSIS — J40 Bronchitis, not specified as acute or chronic: Secondary | ICD-10-CM | POA: Diagnosis not present

## 2022-06-12 HISTORY — DX: Carpal tunnel syndrome, left upper limb: G56.02

## 2022-06-12 MED ORDER — PROMETHAZINE-DM 6.25-15 MG/5ML PO SYRP: 5.0000 mL | ORAL_SOLUTION | Freq: Four times a day (QID) | ORAL | 0 refills | Status: DC | PRN

## 2022-06-12 MED ORDER — ALBUTEROL SULFATE HFA 108 (90 BASE) MCG/ACT IN AERS: 2.0000 | INHALATION_SPRAY | Freq: Four times a day (QID) | RESPIRATORY_TRACT | 0 refills | Status: DC | PRN

## 2022-06-12 NOTE — Assessment & Plan Note (Signed)
Progressively noted over past 3 weeks, in setting of constant cough over that duration. She denies any fevers, chills, urinary changes otherwise. Most likely stress incontinence from 3 weeks cough, home exercises and reassurance provided. Will obtain swab, urine analysis, and culture to assess for any alternate etiologies.

## 2022-06-12 NOTE — Progress Notes (Signed)
Primary Care / Sports Medicine Office Visit  Patient Information:  Patient ID: Alexandra Salinas, female DOB: 1976-03-17 Age: 47 y.o. MRN: [id number]   Alexandra Salinas is a pleasant 47 y.o. female presenting with the following:  Chief Complaint  Patient presents with   Cough    For 3 weeks, did antibiotics and steroids with little relief until recent has started, has pain in neck front and pain.     Vitals:   06/12/22 1057  BP: 126/78  Pulse: 70  SpO2: 98%   Vitals:   06/12/22 1057  Weight: 204 lb (92.5 kg)  Height: 5\' 4"  (1.626 m)   Body mass index is 35.02 kg/m.  No results found.   Independent interpretation of notes and tests performed by another provider:   None  Procedures performed:   None  Pertinent History, Exam, Impression, and Recommendations:   Alexandra Salinas was seen today for cough.  Bronchitis Assessment & Plan: 3-week history of initial upper respiratory symptoms, after 1 week there was subtle improvement followed by progressive worsening, cough, productive of yellowish sputum, denies fevers, chills, multiple sick contacts at home, they have improved.  Feeling fatigued, some shortness of air.  Did perform a virtual visit recently and was advised doxycycline and steroids, subtle improvement with this.  Associated anterior/posterior neck pain, has been noting urine leakage with coughing/sneezing. See additional assessment(s) for plan details.  Examination with baseline oxygen saturation, lung fields do demonstrate equal air entry throughout though there is inspiratory and expiratory wheeze, erythematous turbinates and oropharynx without exudate, tympanic membranes canals are benign, nontender sinuses, no lymphadenopathy noted.  Plan for chest x-ray given duration of symptoms, she has been covered from an antimicrobial standpoint, will augment care with Rx antitussive, as needed albuterol, and OTC mucolytic.  Orders: -     DG Chest 2 View;  Future  Urinary incontinence, unspecified type Assessment & Plan: Progressively noted over past 3 weeks, in setting of constant cough over that duration. She denies any fevers, chills, urinary changes otherwise. Most likely stress incontinence from 3 weeks cough, home exercises and reassurance provided. Will obtain swab, urine analysis, and culture to assess for any alternate etiologies.  Orders: -     Cervicovaginal ancillary only  Carpal tunnel syndrome on left Assessment & Plan: Chronic condition the setting of work-related long-term computer usage, intermittent in nature.  No treatments to date.  Examination with negative Tinel's, positive Phalen's, alternate etiologies can include consideration of cervical spine origin though she has a negative Spurling's on today's examination.  Home-based rehab advised, can utilize OTC night splints if needed, can follow-up as needed for this issue as well.   Other orders -     Albuterol Sulfate HFA; Inhale 2 puffs into the lungs every 6 (six) hours as needed for wheezing or shortness of breath.  Dispense: 8 g; Refill: 0 -     Promethazine-DM; Take 5 mLs by mouth 4 (four) times daily as needed for cough.  Dispense: 118 mL; Refill: 0     Orders & Medications Meds ordered this encounter  Medications   albuterol (VENTOLIN HFA) 108 (90 Base) MCG/ACT inhaler    Sig: Inhale 2 puffs into the lungs every 6 (six) hours as needed for wheezing or shortness of breath.    Dispense:  8 g    Refill:  0   promethazine-dextromethorphan (PROMETHAZINE-DM) 6.25-15 MG/5ML syrup    Sig: Take 5 mLs by mouth 4 (four) times daily as needed for  cough.    Dispense:  118 mL    Refill:  0   Orders Placed This Encounter  Procedures   DG Chest 2 View     No follow-ups on file.     Alexandra Culver, MD, [facility]   Primary Care Sports Medicine Primary Care and Sports Medicine at [facility]

## 2022-06-12 NOTE — Assessment & Plan Note (Signed)
Chronic condition the setting of work-related long-term computer usage, intermittent in nature.  No treatments to date.  Examination with negative Tinel's, positive Phalen's, alternate etiologies can include consideration of cervical spine origin though she has a negative Spurling's on today's examination.  Home-based rehab advised, can utilize OTC night splints if needed, can follow-up as needed for this issue as well.

## 2022-06-12 NOTE — Assessment & Plan Note (Signed)
3-week history of initial upper respiratory symptoms, after 1 week there was subtle improvement followed by progressive worsening, cough, productive of yellowish sputum, denies fevers, chills, multiple sick contacts at home, they have improved.  Feeling fatigued, some shortness of air.  Did perform a virtual visit recently and was advised doxycycline and steroids, subtle improvement with this.  Associated anterior/posterior neck pain, has been noting urine leakage with coughing/sneezing. See additional assessment(s) for plan details.  Examination with baseline oxygen saturation, lung fields do demonstrate equal air entry throughout though there is inspiratory and expiratory wheeze, erythematous turbinates and oropharynx without exudate, tympanic membranes canals are benign, nontender sinuses, no lymphadenopathy noted.  Plan for chest x-ray given duration of symptoms, she has been covered from an antimicrobial standpoint, will augment care with Rx antitussive, as needed albuterol, and OTC mucolytic.

## 2022-06-12 NOTE — Patient Instructions (Signed)
-  Obtain chest x-ray today - Use inhaler scheduled to keep lungs open and as needed for additional breathing support - Start Mucinex 12-hour x 7 days then as needed - Can use cough medicine on as-needed basis - Will contact you the results once available -Start home exercises for the left wrist, can consider "night splint for carpal tunnel "if symptoms worsen - Can review and start Kegel exercises- Contact us for any questions or follow-up as needed

## 2022-06-13 ENCOUNTER — Other Ambulatory Visit: Payer: Self-pay

## 2022-06-13 ENCOUNTER — Encounter: Payer: Self-pay | Admitting: Family Medicine

## 2022-06-13 DIAGNOSIS — R7309 Other abnormal glucose: Secondary | ICD-10-CM

## 2022-06-13 LAB — CERVICOVAGINAL ANCILLARY ONLY
Comment: NEGATIVE
Comment: NEGATIVE
Comment: NEGATIVE
Comment: NEGATIVE
Comment: NEGATIVE
Comment: NORMAL

## 2022-06-13 LAB — HEMOGLOBIN A1C
Est. average glucose Bld gHb Est-mCnc: 120 mg/dL
Hgb A1c MFr Bld: 5.8 % — ABNORMAL HIGH (ref 4.8–5.6)

## 2022-06-24 ENCOUNTER — Encounter: Payer: Self-pay | Admitting: Family Medicine

## 2022-06-28 ENCOUNTER — Encounter: Payer: Self-pay | Admitting: Family Medicine

## 2022-06-28 ENCOUNTER — Ambulatory Visit (INDEPENDENT_AMBULATORY_CARE_PROVIDER_SITE_OTHER): Payer: BC Managed Care – PPO | Admitting: Family Medicine

## 2022-06-28 VITALS — BP 120/78 | HR 78 | Ht 64.0 in | Wt 201.0 lb

## 2022-06-28 DIAGNOSIS — R059 Cough, unspecified: Secondary | ICD-10-CM

## 2022-06-28 DIAGNOSIS — U071 COVID-19: Secondary | ICD-10-CM | POA: Diagnosis not present

## 2022-06-28 DIAGNOSIS — J0100 Acute maxillary sinusitis, unspecified: Secondary | ICD-10-CM | POA: Diagnosis not present

## 2022-06-28 LAB — POC COVID19 BINAXNOW: SARS Coronavirus 2 Ag: NEGATIVE

## 2022-06-28 MED ORDER — AZITHROMYCIN 250 MG PO TABS: ORAL_TABLET | ORAL | 0 refills | Status: AC

## 2022-06-28 NOTE — Assessment & Plan Note (Addendum)
Patient presents with recurrent symptoms.  Of note, following last visit she had noted gradual improvement. Had subsequent exposure to COVID positive individuals and later tested weakly positive despite continued symptom improvement. By this past weekend she began to note new significant facial pressure and congestion, nighttime cough.  COVID test POC negative, examination with clear breath sounds bilaterally, no wheezes, rales, rhonchi, positive S1-S2, regular rate and rhythm, shotty anterior cervical chain lymphadenopathy, oropharynx benign, nasopharynx with erythema and swelling, right greater than left, tympanic members canals benign, tenderness about the maxillary sinuses symmetrically.  Patient's overall clinical course is most consistent with resolving illness complicated by COVID exposure, testing weakly positive for COVID, and most likely secondary infection manifesting as sinusitis.  Plan for antibiotic regimen, supportive care, follow-up as needed.

## 2022-06-28 NOTE — Patient Instructions (Signed)
-   Take antibiotics for full course - Can use previously prescribed cough medicine on as-needed basis - Recommend the following while on antibiotics: Saline nasal spray Flonase, 2 sprays each nostril Claritin/Zyrtec - Contact for any questions and follow-up as needed

## 2022-06-28 NOTE — Progress Notes (Signed)
     Primary Care / Sports Medicine Office Visit  Patient Information:  Patient ID: Alexandra Salinas, female DOB: May 12, 1976 Age: 47 y.o. MRN: [id number]   Alexandra Salinas is a pleasant 47 y.o. female presenting with the following:  Chief Complaint  Patient presents with   Cough    Cough "came back", cong, yesterday tested pos for covid    Vitals:   06/28/22 1043  BP: 120/78  Pulse: 78  SpO2: 98%   Vitals:   06/28/22 1043  Weight: 201 lb (91.2 kg)  Height: 5\' 4"  (1.626 m)   Body mass index is 34.5 kg/m.  DG Chest 2 View  Result Date: 06/14/2022 CLINICAL DATA:  Cough for 3 weeks.  Wheezing. EXAM: CHEST - 2 VIEW COMPARISON:  None available FINDINGS: The heart size and mediastinal contours are within normal limits. Both lungs are clear. The visualized skeletal structures are unremarkable. IMPRESSION: No active cardiopulmonary disease. Electronically Signed   By: Marlaine Hind M.D.   On: 06/14/2022 13:28     Independent interpretation of notes and tests performed by another provider:   None  Procedures performed:   None  Pertinent History, Exam, Impression, and Recommendations:   Alexandra Salinas was seen today for cough.  Acute maxillary sinusitis, recurrence not specified Assessment & Plan: Patient presents with recurrent symptoms.  Of note, following last visit she had noted gradual improvement. Had subsequent exposure to COVID positive individuals and later tested weakly positive despite continued symptom improvement. By this past weekend she began to note new significant facial pressure and congestion, nighttime cough.  COVID test POC negative, examination with clear breath sounds bilaterally, no wheezes, rales, rhonchi, positive S1-S2, regular rate and rhythm, shotty anterior cervical chain lymphadenopathy, oropharynx benign, nasopharynx with erythema and swelling, right greater than left, tympanic members canals benign, tenderness about the maxillary sinuses  symmetrically.  Patient's overall clinical course is most consistent with resolving illness complicated by COVID exposure, testing weakly positive for COVID, and most likely secondary infection manifesting as sinusitis.  Plan for antibiotic regimen, supportive care, follow-up as needed.  Orders: -     Azithromycin; Take 2 tablets on day 1, then 1 tablet daily on days 2 through 5  Dispense: 6 tablet; Refill: 0  Positive self-administered antigen test for COVID-19 -     POC COVID-19 BinaxNow     Orders & Medications Meds ordered this encounter  Medications   azithromycin (ZITHROMAX) 250 MG tablet    Sig: Take 2 tablets on day 1, then 1 tablet daily on days 2 through 5    Dispense:  6 tablet    Refill:  0   Orders Placed This Encounter  Procedures   POC COVID-19     No follow-ups on file.     [name], MD, [facility]   Primary Care Sports Medicine Primary Care and Sports Medicine at [facility]

## 2022-07-05 ENCOUNTER — Ambulatory Visit: Payer: BC Managed Care – PPO | Admitting: Family Medicine

## 2022-07-09 ENCOUNTER — Encounter: Payer: Self-pay | Admitting: Family Medicine

## 2022-07-10 NOTE — Telephone Encounter (Signed)
fyi

## 2022-07-26 DIAGNOSIS — G43109 Migraine with aura, not intractable, without status migrainosus: Secondary | ICD-10-CM | POA: Diagnosis not present

## 2022-07-26 DIAGNOSIS — Z0100 Encounter for examination of eyes and vision without abnormal findings: Secondary | ICD-10-CM | POA: Diagnosis not present

## 2022-07-27 ENCOUNTER — Encounter: Payer: BC Managed Care – PPO | Attending: Family Medicine | Admitting: Dietician

## 2022-07-27 ENCOUNTER — Encounter: Payer: Self-pay | Admitting: Dietician

## 2022-07-27 VITALS — Ht 64.0 in | Wt 201.6 lb

## 2022-07-27 DIAGNOSIS — R7303 Prediabetes: Secondary | ICD-10-CM

## 2022-07-27 DIAGNOSIS — E785 Hyperlipidemia, unspecified: Secondary | ICD-10-CM | POA: Diagnosis not present

## 2022-07-27 NOTE — Patient Instructions (Addendum)
Monitor portions especially of starchy foods Concentrate on eating several servings of veggies and fruits daily. Keep them convenient in the fridge (pre cut and washed), in view rather than in a drawer If hungry soon after eating, try drinking sugar free beverage, and try to wait at least 2 hours before eating a snack. Pre-prep healthy snacks for the day/ week

## 2022-07-27 NOTE — Progress Notes (Signed)
Medical Nutrition Therapy: Visit start time: 0840  end time: 0940  Assessment:   Referral Diagnosis: elevated blood glucose Other medical history/ diagnoses: hyperlipidemia, GERD, obesity Psychosocial issues/ stress concerns: depression, controlled with medications  Medications, supplements: reconciled list in medical record   Preferred learning method:  Auditory Visual Hands-on   Current weight: 201.6lbs Height: 5'4" BMI: 34.6 Patient's personal weight goal: 140  Progress and evaluation:  Patient reports weight gain of over 50lbs over past 2 years Started weight watchers 6 weeks ago, has lost about 3lbs Did 30-day challenge diet about 8 years ago, eating every 2-3 hours, planned menus, exercise, and did well. She reports strong family history of diabetes, some with complications, and she is hoping that lifestyle changes will reduce risk/ prevent diabetes She has started participating in weight watchers 6 weeks ago and continues with the program. Patient reports at times feeling hungry soon after eating a full meal and cannot figure out a reason. 06/12/22 HbA1C 5.8%, BG 105;  total cholesterol 214, HDL 49, LDL 128, triglycerides 207 Food allergies: none Special diet practices: none Patient seeks help with weight loss and reducing diabetes and heart disease risk Next PCP appt is approx. 09/2022   Dietary Intake:  Usual eating pattern includes 3 meals and 1 snacks per day. Dining out frequency: 1 meals per week. Who plans meals/ buys groceries? Self, spouse Who prepares meals? Self, spouse  Breakfast: egg whites sourdough/ ezekiel bread/ eng muffin + coffee with unsweetened almond milk; overnight oats 1/3c + protein powder + skim milk Snack: maybe nuts walnuts/ almonds; fat free Greek yogurt; fruit ie grapes/ strawberries Lunch: salad with grilled chicken/ walnuts; grilled chicken burger (from frozen); eggs if not at breakfast Snack: none Supper: tofu stir fry with rice; grilled  chicken with veg; chickpea pasta with sauce (no meat) + veg;  Snack: recently stopped/ decreased alcohol usually sleepytime tea Beverages: water, 2c coffee in am, herbal tea in evening, some alcoholic beverages; occ diet soda  Physical activity: [employer]/ [employer] zumba or other class  60 minutes 5x a week   Intervention:   Nutrition Care Education:   Basic nutrition: appropriate nutrient balance; appropriate meal and snack schedule; general nutrition guidelines    Weight control: determining reasonable weight loss rate; importance of low sugar and low fat choices; portion control strategies; estimated energy needs for weight loss at 1400 kcal, provided guidance for 45% CHO, 25% pro, 30% fat; role of physical activity and matching energy intake with exercise level to achieve best results Advanced nutrition:  cooking techniques; food label reading for carbohydrate prediabetes:  goals for BGs; appropriate meal and snack schedule; appropriate carb intake and balance, healthy carb choices; role of fiber, protein, fat; physical activity; effects of stress Hyperlipidemia: healthy and unhealthy fats; role of fiber Other: post-meal hunger symptoms and possible causes, ensuring adequate intake at meals; drinking fluids to ease hunger symptoms; allowing additional snack if needed at least 2 hours after meal   Other intervention notes: Patient has been working on significant dietary and lifestyle changes; she is motivated to continue Established additional goals with direction from patient No follow up needed at this time; patient will schedule later if circumstances change   Nutritional Diagnosis:  [place]-2.2 Altered nutrition-related laboratory As related to prediabetes, hyperlipidemia.  As evidenced by elevated HbA1c, total cholesterol, LDL, and triglycerides. [place]-3.3 Overweight/obesity As related to history of excess calories, possible hormonal changes, depression/ medication side effects.   As evidenced by patient with current  BMI of 34.6, working on lifestyle changes to promote weight loss and reduce health risk.   Education Materials given:  General diet guidelines for Diabetes (prevention) Plate Planner with food lists, sample meal pattern Sample menus Visit summary with goals/ instructions   Learner/ who was taught:  Patient   Level of understanding: Verbalizes/ demonstrates competency  Demonstrated degree of understanding via:   Teach back Learning barriers: None  Willingness to learn/ readiness for change: Eager, change in progress   Monitoring and Evaluation:  Dietary intake, exercise, BG control, blood lipids, and body weight      follow up: prn

## 2022-08-02 ENCOUNTER — Encounter: Payer: Self-pay | Admitting: Family Medicine

## 2022-08-02 NOTE — Telephone Encounter (Signed)
Please review. Appt?  KP 

## 2022-08-16 ENCOUNTER — Other Ambulatory Visit: Payer: Self-pay | Admitting: Family Medicine

## 2022-08-16 MED ORDER — ZEPBOUND 2.5 MG/0.5ML ~~LOC~~ SOAJ: 2.5000 mg | SUBCUTANEOUS | 0 refills | Status: DC

## 2022-09-12 NOTE — Telephone Encounter (Signed)
FYI

## 2022-09-24 ENCOUNTER — Ambulatory Visit: Payer: BC Managed Care – PPO | Admitting: Family Medicine

## 2022-10-18 ENCOUNTER — Encounter: Payer: Self-pay | Admitting: Family Medicine

## 2022-10-18 ENCOUNTER — Other Ambulatory Visit: Payer: Self-pay | Admitting: Family Medicine

## 2022-10-18 ENCOUNTER — Ambulatory Visit (INDEPENDENT_AMBULATORY_CARE_PROVIDER_SITE_OTHER): Payer: BC Managed Care – PPO | Admitting: Family Medicine

## 2022-10-18 VITALS — BP 104/74 | HR 78 | Ht 64.0 in | Wt 190.0 lb

## 2022-10-18 DIAGNOSIS — Z7689 Persons encountering health services in other specified circumstances: Secondary | ICD-10-CM | POA: Insufficient documentation

## 2022-10-18 MED ORDER — ZEPBOUND 2.5 MG/0.5ML ~~LOC~~ SOAJ: 2.5000 mg | SUBCUTANEOUS | 2 refills | Status: DC

## 2022-10-18 NOTE — Progress Notes (Signed)
     Primary Care / Sports Medicine Office Visit  Patient Information:  Patient ID: Alexandra Salinas, female DOB: November 02, 1975 Age: 47 y.o. MRN: [id number]   Alexandra Salinas is a pleasant 47 y.o. female presenting with the following:  Chief Complaint  Patient presents with   Weight Loss    Has dropped down 11 pounds    Vitals:   10/18/22 1336  BP: 104/74  Pulse: 78  SpO2: 96%   Vitals:   10/18/22 1336  Weight: 190 lb (86.2 kg)  Height: 5\' 4"  (1.626 m)   Body mass index is 32.61 kg/m.  No results found.   Independent interpretation of notes and tests performed by another provider:   None  Procedures performed:   None  Pertinent History, Exam, Impression, and Recommendations:   [name] was seen today for weight loss.  Encounter for weight management Assessment & Plan: Excellent interim weight loss since starting Zepbound 2.5 mg weekly, has just completed fourth dose.  Denies any nausea, no constipation, has noted appetite suppression but has been addressing this by ensuring adequate food intake throughout the day.  Given her excellent response as an adjunct to healthy lifestyle changes, plan as follows: - Continue current Zepbound 2.5 mg weekly dose - The patient plans to continue the prescribed medication in conjunction with comprehensive lifestyle interventions, including diet and exercise   Orders: -     Zepbound; Inject 2.5 mg into the skin once a week.  Dispense: 2 mL; Refill: 2   I provided a total time of 36 minutes including both face-to-face and non-face-to-face time on 10/18/2022 inclusive of time utilized for medical chart review, information gathering, care coordination with staff, and documentation completion.   Orders & Medications Meds ordered this encounter  Medications   tirzepatide (ZEPBOUND) 2.5 MG/0.5ML Pen    Sig: Inject 2.5 mg into the skin once a week.    Dispense:  2 mL    Refill:  2   No orders of the defined types were  placed in this encounter.    Return in about 3 months (around 01/18/2023).     Jerrol Banana, MD, [facility]   Primary Care Sports Medicine Primary Care and Sports Medicine at [facility]

## 2022-10-18 NOTE — Assessment & Plan Note (Addendum)
Excellent interim weight loss since starting Zepbound 2.5 mg weekly, has just completed fourth dose.  Denies any nausea, no constipation, has noted appetite suppression but has been addressing this by ensuring adequate food intake throughout the day.  Given her excellent response as an adjunct to healthy lifestyle changes, plan as follows: - Continue current Zepbound 2.5 mg weekly dose - The patient plans to continue the prescribed medication in conjunction with comprehensive lifestyle interventions, including diet and exercise

## 2022-10-18 NOTE — Patient Instructions (Addendum)
-   Continue current Zepbound dose weekly - Continue healthy lifestyle changes as you have been doing (diet, exercise) - Ensure adequate nutritional intake - Monitor for any bowel change (constipation) and addressed with increased hydration and fiber - If wanting to increase Zepbound dose, contact our office for next steps - Return for follow-up in 3 months

## 2022-10-19 NOTE — Telephone Encounter (Signed)
Unable to refill per protocol, Rx request is too soon. Last refilled 10/18/22 for 2 ml and 2 refills.E-Prescribing Status: Receipt confirmed by pharmacy (10/18/2022  2:14 PM EDT).  Requested Prescriptions  Pending Prescriptions Disp Refills   ZEPBOUND 2.5 MG/0.5ML Pen [Pharmacy Med Name: Zepbound 2.5 MG/0.5ML Subcutaneous Solution Auto-injector] 4 mL 0    Sig: INJECT 2.5 MG UNDER THE SKIN ONCE WEEKLY     Off-Protocol Failed - 10/18/2022  2:35 PM      Failed - Medication not assigned to a protocol, review manually.      Passed - Valid encounter within last 12 months    Recent Outpatient Visits           Yesterday Encounter for weight management   [place] [facility], Ocie Bob, MD   3 months ago Acute maxillary sinusitis, recurrence not specified   [facility] at [facility], Ocie Bob, MD   4 months ago Bronchitis   [facility] at [facility], Ocie Bob, MD   5 months ago Annual physical exam   [facility] at [facility], Ocie Bob, MD   1 year ago Vertigo   [facility] Primary Care & Sports Medicine at [facility], Ocie Bob, MD       Future Appointments             In 3 months Ashley Royalty, Ocie Bob, MD [facility] Primary Care & Sports Medicine at [facility], [facility]   In 7 months Ashley Royalty, Ocie Bob, MD [facility] Primary Care & Sports Medicine at [facility], [facility]   In 10 months Willeen Niece, MD [facility]

## 2022-10-22 ENCOUNTER — Ambulatory Visit: Payer: BC Managed Care – PPO | Admitting: Family Medicine

## 2022-10-22 ENCOUNTER — Other Ambulatory Visit: Payer: Self-pay

## 2022-10-22 ENCOUNTER — Encounter: Payer: Self-pay | Admitting: Family Medicine

## 2022-10-22 DIAGNOSIS — R9431 Abnormal electrocardiogram [ECG] [EKG]: Secondary | ICD-10-CM

## 2022-10-22 DIAGNOSIS — R011 Cardiac murmur, unspecified: Secondary | ICD-10-CM

## 2022-10-22 NOTE — Telephone Encounter (Signed)
Referral sent     KP 

## 2022-10-22 NOTE — Telephone Encounter (Signed)
Please review.  KP

## 2022-10-23 ENCOUNTER — Ambulatory Visit: Payer: BC Managed Care – PPO | Admitting: Family Medicine

## 2022-10-23 NOTE — Telephone Encounter (Signed)
It automatically goes into media when a patient attaches a file.  KP

## 2022-11-17 ENCOUNTER — Other Ambulatory Visit: Payer: Self-pay | Admitting: Nurse Practitioner

## 2022-11-17 ENCOUNTER — Other Ambulatory Visit: Payer: Self-pay | Admitting: Family Medicine

## 2022-11-17 DIAGNOSIS — J0140 Acute pansinusitis, unspecified: Secondary | ICD-10-CM

## 2022-11-17 DIAGNOSIS — J0100 Acute maxillary sinusitis, unspecified: Secondary | ICD-10-CM

## 2022-11-17 DIAGNOSIS — J40 Bronchitis, not specified as acute or chronic: Secondary | ICD-10-CM

## 2022-11-19 NOTE — Telephone Encounter (Signed)
Requested by interface surscripts. Promethazine, ventolin discontinued 06/28/22, zithromax completed course/ expired medication- 07/03/22, vit D2 discontinued 06/12/22.  Requested Prescriptions  Refused Prescriptions Disp Refills   promethazine-dextromethorphan (PROMETHAZINE-DM) 6.25-15 MG/5ML syrup [Pharmacy Med Name: PROMETHAZINE-DM 6.25-15 MG/5ML] 118 mL 0    Sig: TAKE 5 MLS BY MOUTH 4 TIMES A DAY AS NEEDED FOR COUGH     Ear, Nose, and Throat:  Antitussives/Expectorants Passed - 11/17/2022  8:51 AM      Passed - Valid encounter within last 12 months    Recent Outpatient Visits           1 month ago Encounter for weight management   [place] [facility], Ocie Bob, MD   4 months ago Acute maxillary sinusitis, recurrence not specified   [place] [facility] at [facility], Ocie Bob, MD   5 months ago Bronchitis   [place] [facility], Ocie Bob, MD   6 months ago Annual physical exam   [facility] Primary Care & Sports Medicine at [facility], Ocie Bob, MD   1 year ago Vertigo   [facility] Primary Care & Sports Medicine at [facility], Ocie Bob, MD       Future Appointments             In 1 month Ashley Royalty, Ocie Bob, MD [facility] Primary Care & Sports Medicine at [facility], [facility]   In 6 months Ashley Royalty, Ocie Bob, MD [facility] Health Primary Care & Sports Medicine at [facility], [facility]   In 9 months Willeen Niece, MD [facility]             albuterol (VENTOLIN HFA) 108 (90 Base) MCG/ACT inhaler [Pharmacy Med Name: ALBUTEROL HFA (PROAIR) INHALER] 8.5 each     Sig: TAKE 2 PUFFS BY MOUTH EVERY 6 HOURS AS NEEDED FOR WHEEZE OR SHORTNESS OF BREATH     Pulmonology:  Beta Agonists 2 Passed - 11/17/2022  8:51 AM      Passed - Last BP in normal range    BP Readings from Last 1 Encounters:  10/18/22 104/74          Passed - Last Heart Rate in normal range    Pulse Readings from Last 1 Encounters:  10/18/22 78         Passed - Valid encounter within last 12 months    Recent Outpatient Visits           1 month ago Encounter for weight management   [place] [facility], Ocie Bob, MD   4 months ago Acute maxillary sinusitis, recurrence not specified   [place] [facility] at [facility], Ocie Bob, MD   5 months ago Bronchitis   [facility] Primary Care & [facility] Medicine at [facility], Ocie Bob, MD   6 months ago Annual physical exam   [facility] Primary Care & Sports Medicine at [facility], Ocie Bob, MD   1 year ago Vertigo   [facility] Primary Care & Sports Medicine at [facility], Ocie Bob, MD       Future Appointments             In 1 month Ashley Royalty, Ocie Bob, MD [facility] Primary Care & Sports Medicine at [facility], [facility]  In 6 months Ashley Royalty, Ocie Bob, MD [facility] Health Primary Care & Sports Medicine at [facility], [facility]   In 9 months Willeen Niece, MD [facility]             azithromycin (ZITHROMAX) 250 MG tablet [Pharmacy Med Name: AZITHROMYCIN 250 MG TABLET] 6 tablet 0    Sig: TAKE 2 TABLETS BY MOUTH TODAY, THEN TAKE 1 TABLET DAILY FOR 4 DAYS AS DIRECTED     Off-Protocol Failed - 11/17/2022  8:51 AM      Failed - Medication not assigned to a protocol, review manually.      Passed - Valid encounter within last 12 months    Recent Outpatient Visits           1 month ago Encounter for weight management   [place] [facility] Royalty, Ocie Bob, MD   4 months ago Acute maxillary sinusitis, recurrence not specified   [place] [facility], Ocie Bob, MD   5 months ago Bronchitis   [facility] Primary Care & Sports Medicine at  [facility], Ocie Bob, MD   6 months ago Annual physical exam   [facility] Health Primary Care & Sports Medicine at [facility], Ocie Bob, MD   1 year ago Vertigo   [facility] Health Primary Care & Sports Medicine at [facility], Ocie Bob, MD       Future Appointments             In 1 month Ashley Royalty Ocie Bob, MD [facility] Health Primary Care & Sports Medicine at [facility], [facility]   In 6 months Ashley Royalty, Ocie Bob, MD [facility] Health Primary Care & Sports Medicine at [facility], [facility]   In 9 months Willeen Niece, MD [facility]             Vitamin D, Ergocalciferol, (DRISDOL) 1.25 MG (50000 UNIT) CAPS capsule [Pharmacy Med Name: VITAMIN D2 1.25MG (50,000 UNIT)] 4 capsule 1    Sig: TAKE 1 CAPSULE (50,000 UNITS TOTAL) BY MOUTH EVERY 7 (SEVEN) DAYS. TAKE FOR 8 TOTAL DOSES(WEEKS)     Endocrinology:  Vitamins - Vitamin D Supplementation 2 Failed - 11/17/2022  8:51 AM      Failed - Manual Review: Route requests for 50,000 IU strength to the provider      Failed - Vitamin D in normal range and within 360 days    Vit D, 25-Hydroxy  Date Value Ref Range Status  05/09/2022 24.3 (L) 30.0 - 100.0 ng/mL Final    Comment:    Vitamin D deficiency has been defined by the Institute of Medicine and an Endocrine Society practice guideline as a level of serum 25-OH vitamin D less than 20 ng/mL (1,2). The Endocrine Society went on to further define vitamin D insufficiency as a level between 21 and 29 ng/mL (2). 1. IOM (Institute of Medicine). 2010. Dietary reference    intakes for calcium and D. [place]: The    [employer]. 2. Holick MF, Binkley Poston, Bischoff-Ferrari HA, et al.    Evaluation, treatment, and prevention of vitamin D    deficiency: an Endocrine Society clinical practice    guideline. JCEM. 2011 Jul; 96(7):1911-30.          Passed - Ca in normal range and within 360 days    Calcium  Date Value Ref Range Status   05/09/2022 9.4  8.7 - 10.2 mg/dL Final         Passed - Valid encounter within last 12 months    Recent Outpatient Visits           1 month ago Encounter for weight management   [place] [facility], Ocie Bob, MD   4 months ago Acute maxillary sinusitis, recurrence not specified   [facility] Primary Care & Sports Medicine at [facility], Ocie Bob, MD   5 months ago Bronchitis   [facility] Primary Care & Sports Medicine at [facility], Ocie Bob, MD   6 months ago Annual physical exam   [facility] Care & [facility] at [facility], Ocie Bob, MD   1 year ago Vertigo   [facility] Primary Care & Sports Medicine at [facility], Ocie Bob, MD       Future Appointments             In 1 month Ashley Royalty, Ocie Bob, MD [facility] Health Primary Care & Sports Medicine at [facility], [facility]   In 6 months Ashley Royalty, Ocie Bob, MD [facility] Health Primary Care & Sports Medicine at [facility], [facility]   In 9 months Willeen Niece, MD [facility]

## 2023-01-09 ENCOUNTER — Ambulatory Visit (INDEPENDENT_AMBULATORY_CARE_PROVIDER_SITE_OTHER): Payer: BC Managed Care – PPO | Admitting: Family Medicine

## 2023-01-09 ENCOUNTER — Encounter: Payer: Self-pay | Admitting: Family Medicine

## 2023-01-09 VITALS — BP 126/78 | HR 80 | Ht 64.0 in | Wt 175.0 lb

## 2023-01-09 DIAGNOSIS — Z7689 Persons encountering health services in other specified circumstances: Secondary | ICD-10-CM

## 2023-01-09 MED ORDER — ZEPBOUND 5 MG/0.5ML ~~LOC~~ SOAJ
5.0000 mg | SUBCUTANEOUS | 2 refills | Status: DC
Start: 2023-01-09 — End: 2023-01-14

## 2023-01-09 NOTE — Progress Notes (Signed)
     Primary Care / Sports Medicine Office Visit  Patient Information:  Patient ID: Alexandra Salinas, female DOB: [birth year] Age: 47 y.o. MRN: [id number]   Alexandra Salinas is a pleasant 47 y.o. female presenting with the following:  No chief complaint on file.   Vitals:   01/09/23 0907  BP: 126/78  Pulse: 80   Vitals:   01/09/23 0907  Weight: 175 lb (79.4 kg)  Height: 5\' 4"  (1.626 m)   Body mass index is 30.04 kg/m.  No results found.   Independent interpretation of notes and tests performed by another provider:   None  Procedures performed:   None  Pertinent History, Exam, Impression, and Recommendations:   Encounter for weight management Assessment & Plan: Patient has demonstrated excellent interval weight loss (15 pounds) since last visit.  She reports no issues with tolerance, specifically no nausea, emesis, abdominal pain/bloating, or change in bowels.  She does have reflux which continues to be symptomatic based on lifestyle (meal timing, etc.).  Given her excellent response and tolerability, we discussed next steps.  Plan: - Increase to Zepbound 5 mg weekly - Closely monitor for any GI changes and address them accordingly - Contact us for any questions/concerns - Return for follow-up in 3 months, can contact us if wishing to increase dose sooner, we can schedule a sooner visit accordingly  The patient plans to use the prescribed medication in conjunction with comprehensive lifestyle interventions, including diet and exercise.   Orders: -     Zepbound; Inject 5 mg into the skin once a week.  Dispense: 2 mL; Refill: 2     Orders & Medications Meds ordered this encounter  Medications   tirzepatide (ZEPBOUND) 5 MG/0.5ML Pen    Sig: Inject 5 mg into the skin once a week.    Dispense:  2 mL    Refill:  2   No orders of the defined types were placed in this encounter.    No follow-ups on file.     Jerrol Banana, MD, [facility]    Primary Care Sports Medicine Primary Care and Sports Medicine at [facility]

## 2023-01-09 NOTE — Assessment & Plan Note (Addendum)
Patient has demonstrated excellent interval weight loss (15 pounds) since last visit.  She reports no issues with tolerance, specifically no nausea, emesis, abdominal pain/bloating, or change in bowels.  She does have reflux which continues to be symptomatic based on lifestyle (meal timing, etc.).  Given her excellent response and tolerability, we discussed next steps.  Plan: - Increase to Zepbound 5 mg weekly - Closely monitor for any GI changes and address them accordingly - Contact us for any questions/concerns - Return for follow-up in 3 months, can contact us if wishing to increase dose sooner, we can schedule a sooner visit accordingly  The patient plans to use the prescribed medication in conjunction with comprehensive lifestyle interventions, including diet and exercise.

## 2023-01-09 NOTE — Patient Instructions (Signed)
-   Increase to Zepbound 5 mg weekly - Closely monitor for any GI changes and address them accordingly - Contact us for any questions/concerns - Continue healthy lifestyle changes as you have been doing - Return for follow-up in 3 months, can contact us if wishing to increase dose sooner, we can schedule a sooner visit accordingly

## 2023-01-12 ENCOUNTER — Other Ambulatory Visit: Payer: Self-pay | Admitting: Family Medicine

## 2023-01-12 DIAGNOSIS — Z7689 Persons encountering health services in other specified circumstances: Secondary | ICD-10-CM

## 2023-01-14 ENCOUNTER — Encounter: Payer: Self-pay | Admitting: Family Medicine

## 2023-01-14 ENCOUNTER — Other Ambulatory Visit: Payer: Self-pay

## 2023-01-14 DIAGNOSIS — Z7689 Persons encountering health services in other specified circumstances: Secondary | ICD-10-CM

## 2023-01-14 MED ORDER — ZEPBOUND 5 MG/0.5ML ~~LOC~~ SOAJ: 5.0000 mg | SUBCUTANEOUS | 2 refills | Status: DC

## 2023-01-18 ENCOUNTER — Ambulatory Visit: Payer: BC Managed Care – PPO | Admitting: Family Medicine

## 2023-03-29 ENCOUNTER — Ambulatory Visit (INDEPENDENT_AMBULATORY_CARE_PROVIDER_SITE_OTHER): Payer: BC Managed Care – PPO | Admitting: Family Medicine

## 2023-03-29 VITALS — BP 122/78 | HR 74 | Ht 64.0 in | Wt 159.8 lb

## 2023-03-29 DIAGNOSIS — M1611 Unilateral primary osteoarthritis, right hip: Secondary | ICD-10-CM | POA: Diagnosis not present

## 2023-03-29 DIAGNOSIS — Z7689 Persons encountering health services in other specified circumstances: Secondary | ICD-10-CM

## 2023-03-29 MED ORDER — ZEPBOUND 5 MG/0.5ML ~~LOC~~ SOAJ
5.0000 mg | SUBCUTANEOUS | 2 refills | Status: DC
Start: 2023-03-29 — End: 2023-05-23

## 2023-03-29 NOTE — Progress Notes (Signed)
Primary Care / Sports Medicine Office Visit  Patient Information:  Patient ID: Alexandra Salinas, female DOB: 05-30-1975 Age: 47 y.o. MRN: [id number]   Alexandra Salinas is a pleasant 47 y.o. female presenting with the following:  Chief Complaint  Patient presents with   Weight Check    Vitals:   03/29/23 0901  BP: 122/78  Pulse: 74  SpO2: 97%   Vitals:   03/29/23 0901  Weight: 159 lb 12.8 oz (72.5 kg)  Height: 5\' 4"  (1.626 m)   Body mass index is 27.43 kg/m.  No results found.   Independent interpretation of notes and tests performed by another provider:   Right hip X-ray from 2022 shows mild to moderate osteoarthritis with joint space narrowing, cam deformity, and early osteophyte formation worse on right compared to left  Procedures performed:   None  Pertinent History, Exam, Impression, and Recommendations:   Problem List Items Addressed This Visit       Musculoskeletal and Integument   Primary osteoarthritis of right hip - Primary    Pertinent History: - Favors right side with forward bend, pulling sensation more in knee than low back, limited ROM with activities.  Exam: - No pain with knee flexion or extension - Anterior hip/groin pain with hip internal rotation - Mild pain at greater trochanter - No SI joint or central buttock tenderness tender at the gluteus medius mid-distally on right - Right hip X-ray from 2022 shows mild to moderate osteoarthritis with joint space narrowing, cam deformity, and early osteophyte formation worse on right compared to left  Impression: - M16.11 Unilateral primary osteoarthritis, right hip Differential diagnoses include hip labral tear, femoral acetabular impingement, and trochanteric bursitis. However, imaging findings are most consistent with osteoarthritis as the primary cause of symptoms.  Plan: - Provided hip conditioning program handout with stretches and strengthening exercises - Discussed options  of NSAIDs like ibuprofen or Rx anti-inflammatory if needed to facilitate exercise - Cortisone injection is another option to consider for temporary relief and window to advance hip rehab - PRP injection may be future consideration if still significantly limited after maximizing conservative treatment - Re-evaluation of hip symptoms at 3 month follow-up, sooner if any issues arise        Other   Encounter for weight management    Pertinent History: - Reports fatigue the day after taking Zepbound 5mg , but no GI or stomach issues - Bowel movements are daily and normal consistency - Quit burn boot camp and joined [employer], does solo workouts and a weekly aerobics class - Does yoga on Sundays which helps with mobility  Exam: - Achieving excellent weight loss of 45 lbs over past year, now 159 lbs as of 03/29/2023  Impression: - Encounter for weight management  Plan: - Continue current dose of Zepbound 5mg  daily as achieving excellent weight loss - 3 month follow-up with weight check      Relevant Medications   tirzepatide (ZEPBOUND) 5 MG/0.5ML Pen     Orders & Medications Medications:  Meds ordered this encounter  Medications   tirzepatide (ZEPBOUND) 5 MG/0.5ML Pen    Sig: Inject 5 mg into the skin once a week.    Dispense:  2 mL    Refill:  2   No orders of the defined types were placed in this encounter.    No follow-ups on file.     Jerrol Banana, MD, [facility]   Primary Care Sports Medicine Primary Care and  Sports Medicine at [employer]

## 2023-03-29 NOTE — Assessment & Plan Note (Signed)
Pertinent History: - Reports fatigue the day after taking Zepbound 5mg , but no GI or stomach issues - Bowel movements are daily and normal consistency - Quit burn boot camp and joined [employer], does solo workouts and a weekly aerobics class - Does yoga on Sundays which helps with mobility  Exam: - Achieving excellent weight loss of 45 lbs over past year, now 159 lbs as of 03/29/2023  Impression: - Encounter for weight management  Plan: - Continue current dose of Zepbound 5mg  daily as achieving excellent weight loss - 3 month follow-up with weight check

## 2023-03-29 NOTE — Patient Instructions (Signed)
-   Continue current Zepbound 5 mg weekly dose, Rx sent - Do daily stretches and strengthening exercises for hip 2-3x/week as discussed - Can take OTC ibuprofen or Aleve as needed for hip pain, especially to help with doing exercises - Let us know if needing Rx anti-inflammatory or having difficulty doing exercises even with ibuprofen - May consider cortisone shot in future if hip still very bothersome despite above treatments - Follow-up with Dr. Ashley Royalty in 3 months for hip and weight re-check, sooner if any problems Red flags to watch for and notify: Worsening hip pain not improving with treatments, pain radiating down leg, numbness/tingling in leg or foot, or sudden inability to bear weight on leg.

## 2023-03-29 NOTE — Assessment & Plan Note (Signed)
Pertinent History: - Favors right side with forward bend, pulling sensation more in knee than low back, limited ROM with activities.  Exam: - No pain with knee flexion or extension - Anterior hip/groin pain with hip internal rotation - Mild pain at greater trochanter - No SI joint or central buttock tenderness tender at the gluteus medius mid-distally on right - Right hip X-ray from 2022 shows mild to moderate osteoarthritis with joint space narrowing, cam deformity, and early osteophyte formation worse on right compared to left  Impression: - M16.11 Unilateral primary osteoarthritis, right hip Differential diagnoses include hip labral tear, femoral acetabular impingement, and trochanteric bursitis. However, imaging findings are most consistent with osteoarthritis as the primary cause of symptoms.  Plan: - Provided hip conditioning program handout with stretches and strengthening exercises - Discussed options of NSAIDs like ibuprofen or Rx anti-inflammatory if needed to facilitate exercise - Cortisone injection is another option to consider for temporary relief and window to advance hip rehab - PRP injection may be future consideration if still significantly limited after maximizing conservative treatment - Re-evaluation of hip symptoms at 3 month follow-up, sooner if any issues arise

## 2023-04-02 ENCOUNTER — Ambulatory Visit: Payer: BC Managed Care – PPO | Attending: Cardiology | Admitting: Cardiology

## 2023-04-02 ENCOUNTER — Encounter: Payer: Self-pay | Admitting: Cardiology

## 2023-04-02 VITALS — BP 115/80 | HR 62 | Resp 16 | Ht 64.0 in | Wt 160.0 lb

## 2023-04-02 DIAGNOSIS — R072 Precordial pain: Secondary | ICD-10-CM | POA: Insufficient documentation

## 2023-04-02 DIAGNOSIS — E782 Mixed hyperlipidemia: Secondary | ICD-10-CM | POA: Insufficient documentation

## 2023-04-02 NOTE — Patient Instructions (Signed)
Medication Instructions:   Your physician recommends that you continue on your current medications as directed. Please refer to the Current Medication list given to you today.  *If you need a refill on your cardiac medications before your next appointment, please call your pharmacy*   Lab Work:  SAME DAY AS YOU COME IN FOR YOUR ECHO--CHECK LIPIDS--PLEASE COME FASTING TO THIS LAB APPOINTMENT  If you have labs (blood work) drawn today and your tests are completely normal, you will receive your results only by: MyChart Message (if you have MyChart) OR A paper copy in the mail If you have any lab test that is abnormal or we need to change your treatment, we will call you to review the results.   Testing/Procedures:  Your physician has requested that you have an echocardiogram. Echocardiography is a painless test that uses sound waves to create images of your heart. It provides your doctor with information about the size and shape of your heart and how well your heart's chambers and valves are working. This procedure takes approximately one hour. There are no restrictions for this procedure.  PLEASE HAVE YOUR LIPIDS DONE SAME DAY AS THIS APPOINTMENT  Please do NOT wear cologne, perfume, aftershave, or lotions (deodorant is allowed). Please arrive 15 minutes prior to your appointment time.  Please note: We ask at that you not bring children with you during ultrasound (echo/ vascular) testing. Due to room size and safety concerns, children are not allowed in the ultrasound rooms during exams. Our front office staff cannot provide observation of children in our lobby area while testing is being conducted. An adult accompanying a patient to their appointment will only be allowed in the ultrasound room at the discretion of the ultrasound technician under special circumstances. We apologize for any inconvenience.    CARDIAC CALCIUM SCORE (SELF PAY)    Exercise Tolerance Test  Please arrive 15  minutes prior to your appointment time for registration and insurance purposes.  The test will take approximately 45 minutes to complete.  How to prepare for your Exercise Stress Test: Do bring a list of your current medications with you.  If not listed below, you may take your medications as normal. Do wear comfortable clothes (no dresses or overalls) and walking shoes, tennis shoes preferred (no heels or open toed shoes are allowed) Do Not wear cologne, perfume, aftershave or lotions (deodorant is allowed). Please report to [address], [address] for your test.  If these instructions are not followed, your test will have to be rescheduled.  If you have questions or concerns about your appointment, you can call the Stress Lab at [phone].  If you cannot keep your appointment, please provide 24 hours notification to the Stress Lab, to avoid a possible $50 charge to your account.    Follow-Up:  AS NEEDED WITH DR. PATWARDHAN

## 2023-04-02 NOTE — Progress Notes (Addendum)
Cardiology Office Note:  .   Date:  04/02/2023  ID:  Alexandra Salinas, DOB 1975-12-13, MRN [id number] PCP: Jerrol Banana, MD  [facility] Providers Cardiologist:  Truett Mainland, MD PCP: Jerrol Banana, MD  Chief Complaint  Patient presents with   Heart Murmur   Abnormal ECG   New Patient (Initial Visit)      History of Present Illness: .    Alexandra Salinas is a 47 y.o. female with hyperlipidemia, family history of CAD  Patient previously lived in [place], [place], now lives in [place].  She works as a Surveyor, minerals for [employer].  While her work is sedentary, she exercises 5 times a week without any complaints of chest pain or shortness of breath.  She does report episodes of chest pain lasting for few seconds a few minutes at a time, unrelated to exertion.  She was previously told to have more more, underwent echocardiogram in 2020 in [place].  Results not available to me.  Reviewed recent nuclear results with the patient.  Patient is family history of CAD with father having had CABG, mother had aortic stenosis.  Patient is a former smoker, quit in 2017.  Vitals:   04/02/23 1546  BP: 115/80  Pulse: 62  Resp: 16  SpO2: 99%     ROS:  Review of Systems  Cardiovascular:  Positive for chest pain. Negative for dyspnea on exertion, leg swelling, palpitations and syncope.     Studies Reviewed: Marland Kitchen       EKG 04/02/2023: Normal sinus rhythm Normal ECG No previous ECGs available    Labs 04/2022: Chol 214, TG 207, HDL 49, LDL 128    Physical Exam:   Physical Exam Vitals and nursing note reviewed.  Constitutional:      General: She is not in acute distress. Neck:     Vascular: No JVD.  Cardiovascular:     Rate and Rhythm: Normal rate and regular rhythm.     Heart sounds: Normal heart sounds. No murmur heard. Pulmonary:     Effort: Pulmonary effort is normal.     Breath sounds: Normal breath sounds. No wheezing or  rales.  Musculoskeletal:     Right lower leg: No edema.     Left lower leg: No edema.      VISIT DIAGNOSES:   ICD-10-CM   1. Precordial chest pain  R07.2 EKG 12-Lead    ECHOCARDIOGRAM COMPLETE    Exercise Tolerance Test    Cardiac Stress Test: Informed Consent Details: Physician/Practitioner Attestation; Transcribe to consent form and obtain patient signature    2. Mixed hyperlipidemia  E78.2 CT CARDIAC SCORING (SELF PAY ONLY)    Lipid Profile    Lipid Profile       ASSESSMENT AND PLAN: .    Alexandra Salinas is a 47 y.o. female with hyperlipidemia, family history of CAD  Chest pain: Most likely nonanginal.  Recommend exercise treadmill stress test.  In addition, we will also obtain CT cardiac scoring scan, primarily for risk stratification given her hyperlipidemia and family history of CAD.  I did not appreciate murmur on exam.  Mixed hyperlipidemia: Reviewed lipid panel results with the patient.  Will obtain calcium score scan for risk stratification.  Discussed heart healthy diet and lifestyle.   Informed Consent   Shared Decision Making/Informed Consent{ The risks [chest pain, shortness of breath, cardiac arrhythmias, dizziness, blood pressure fluctuations, myocardial infarction, stroke/transient ischemic attack, and life-threatening complications (estimated to be 1  in 10,000)], benefits (risk stratification, diagnosing coronary artery disease, treatment guidance) and alternatives of an exercise tolerance test were discussed in detail with Ms. Nomura and she agrees to proceed.       No orders of the defined types were placed in this encounter.    F/u as needed  Signed, Elder Negus, MD

## 2023-04-11 ENCOUNTER — Ambulatory Visit: Payer: BC Managed Care – PPO | Admitting: Family Medicine

## 2023-04-16 ENCOUNTER — Ambulatory Visit: Payer: BC Managed Care – PPO | Attending: Internal Medicine

## 2023-04-16 DIAGNOSIS — R072 Precordial pain: Secondary | ICD-10-CM

## 2023-04-16 DIAGNOSIS — E782 Mixed hyperlipidemia: Secondary | ICD-10-CM | POA: Diagnosis not present

## 2023-04-16 LAB — EXERCISE TOLERANCE TEST
Angina Index: 0
Duke Treadmill Score: 10
Estimated workload: 11.7
Exercise duration (min): 10 min
Exercise duration (sec): 0 s
MPHR: 171 {beats}/min
Peak HR: 173 {beats}/min
Percent HR: 101 %
RPE: 17
Rest HR: 81 {beats}/min
ST Depression (mm): 0 mm

## 2023-04-17 LAB — LIPID PANEL
Chol/HDL Ratio: 3.7 {ratio} (ref 0.0–4.4)
Cholesterol, Total: 184 mg/dL (ref 100–199)
HDL: 50 mg/dL (ref >39)
LDL Chol Calc (NIH): 118 mg/dL — ABNORMAL HIGH (ref 0–99)
Triglycerides: 89 mg/dL (ref 0–149)
VLDL Cholesterol Cal: 16 mg/dL (ref 5–40)

## 2023-05-07 ENCOUNTER — Ambulatory Visit (HOSPITAL_COMMUNITY): Payer: BC Managed Care – PPO | Attending: Cardiology

## 2023-05-07 DIAGNOSIS — R072 Precordial pain: Secondary | ICD-10-CM | POA: Insufficient documentation

## 2023-05-07 LAB — ECHOCARDIOGRAM COMPLETE
Area-P 1/2: 4.15 cm2
S' Lateral: 2.9 cm

## 2023-05-08 NOTE — Progress Notes (Signed)
Normal Echo

## 2023-05-17 ENCOUNTER — Ambulatory Visit (HOSPITAL_BASED_OUTPATIENT_CLINIC_OR_DEPARTMENT_OTHER)
Admission: RE | Admit: 2023-05-17 | Discharge: 2023-05-17 | Disposition: A | Discharge status: Home / Self Care | Payer: Self-pay | Source: Ambulatory Visit | Attending: Cardiology | Admitting: Cardiology

## 2023-05-17 DIAGNOSIS — E782 Mixed hyperlipidemia: Secondary | ICD-10-CM | POA: Insufficient documentation

## 2023-05-23 ENCOUNTER — Ambulatory Visit (INDEPENDENT_AMBULATORY_CARE_PROVIDER_SITE_OTHER): Payer: BC Managed Care – PPO | Admitting: Family Medicine

## 2023-05-23 ENCOUNTER — Encounter: Payer: Self-pay | Admitting: Family Medicine

## 2023-05-23 VITALS — BP 108/78 | HR 79 | Ht 64.0 in | Wt 152.6 lb

## 2023-05-23 DIAGNOSIS — Z124 Encounter for screening for malignant neoplasm of cervix: Secondary | ICD-10-CM

## 2023-05-23 DIAGNOSIS — E559 Vitamin D deficiency, unspecified: Secondary | ICD-10-CM

## 2023-05-23 DIAGNOSIS — Z85828 Personal history of other malignant neoplasm of skin: Secondary | ICD-10-CM

## 2023-05-23 DIAGNOSIS — Z9889 Other specified postprocedural states: Secondary | ICD-10-CM

## 2023-05-23 DIAGNOSIS — Z7689 Persons encountering health services in other specified circumstances: Secondary | ICD-10-CM

## 2023-05-23 DIAGNOSIS — F418 Other specified anxiety disorders: Secondary | ICD-10-CM

## 2023-05-23 DIAGNOSIS — Z131 Encounter for screening for diabetes mellitus: Secondary | ICD-10-CM

## 2023-05-23 DIAGNOSIS — E782 Mixed hyperlipidemia: Secondary | ICD-10-CM

## 2023-05-23 DIAGNOSIS — M160 Bilateral primary osteoarthritis of hip: Secondary | ICD-10-CM

## 2023-05-23 DIAGNOSIS — Z0000 Encounter for general adult medical examination without abnormal findings: Secondary | ICD-10-CM

## 2023-05-23 DIAGNOSIS — Z1231 Encounter for screening mammogram for malignant neoplasm of breast: Secondary | ICD-10-CM

## 2023-05-23 DIAGNOSIS — G5602 Carpal tunnel syndrome, left upper limb: Secondary | ICD-10-CM

## 2023-05-23 HISTORY — DX: Other specified postprocedural states: Z98.890

## 2023-05-23 MED ORDER — ZEPBOUND 5 MG/0.5ML ~~LOC~~ SOAJ: 5.0000 mg | SUBCUTANEOUS | 5 refills | Status: DC

## 2023-05-23 NOTE — Assessment & Plan Note (Signed)
 Weight Management Continued weight loss. -Continue current management.

## 2023-05-23 NOTE — Patient Instructions (Signed)
-   Obtain fasting labs with orders provided (can have water or black coffee but otherwise no food or drink x 8 hours before labs) - Review information provided - Attend eye doctor annually, dentist every 6 months, work towards or maintain 30 minutes of moderate intensity physical activity at least 5 days per week, and consume a balanced diet - Return in 6 months - Contact us for any questions between now and then

## 2023-05-23 NOTE — Assessment & Plan Note (Signed)
 Hip Pain Mild symptoms, currently managed with exercises and stretches. Discussed options for further intervention if needed. -Continue current management.

## 2023-05-23 NOTE — Assessment & Plan Note (Signed)
 Mental Health Low scores on GAD and PHQ.  Currently seeing telemedicine group, patient expressed interest in referral to local psychiatry group. -Referral to psychiatry for assessment of care, further evaluation and management.

## 2023-05-23 NOTE — Progress Notes (Signed)
 Annual Physical Exam Visit  Patient Information:  Patient ID: Alexandra Salinas, female DOB: 1975-08-07 Age: 48 y.o. MRN: [id number]   Subjective:   CC: Annual Physical Exam  HPI:  Alexandra Salinas is here for their annual physical.  I reviewed the past medical history, family history, social history, surgical history, and allergies today and changes were made as necessary.  Please see the problem list section below for additional details.  Past Medical History: Past Medical History:  Diagnosis Date   Anxiety    Depression    Diverticulitis    GERD (gastroesophageal reflux disease)    H/O basal cell carcinoma excision 05/23/2023   Past Surgical History: Past Surgical History:  Procedure Laterality Date   BASAL CELL CARCINOMA EXCISION  10/2021   forehead   CESAREAN SECTION  2021   COLONOSCOPY  2020   ESOPHAGOGASTRODUODENOSCOPY  11/2020   REFRACTIVE SURGERY Bilateral 2015   Family History: Family History  Problem Relation Age of Onset   Depression Mother    Kidney disease Mother    Hypertension Mother    Diabetes Mother    Vascular Disease Mother    Dementia Mother    Stroke Mother    Heart disease Father    Colon cancer Father    Hyperlipidemia Brother    Hypertension Brother    Depression Brother    Alcoholism Brother    Diabetes Maternal Grandmother    Heart disease Maternal Grandmother    Stroke Maternal Grandfather    Lung cancer Paternal Grandmother    Allergies: No Known Allergies Health Maintenance: Health Maintenance  Topic Date Due   COVID-19 Vaccine (1) 06/08/2023 (Originally 01/18/1981)   Cervical Cancer Screening (HPV/Pap Cotest)  05/02/2026   Colonoscopy  05/21/2028   Hepatitis C Screening  Completed   HIV Screening  Completed   HPV VACCINES  Aged Out   DTaP/Tdap/Td  Discontinued   INFLUENZA VACCINE  Discontinued    HM Colonoscopy          Upcoming     Colonoscopy (Every 10 Years) Next due on 05/21/2028    05/21/2018   Done - [place]   Only the first 1 history entries have been loaded, but more history exists.               Medications: Current Outpatient Medications on File Prior to Visit  Medication Sig Dispense Refill   busPIRone  (BUSPAR ) 5 MG tablet Take 5 mg by mouth 2 (two) times daily.     escitalopram  (LEXAPRO ) 20 MG tablet Take 20 mg by mouth daily.     famotidine (PEPCID) 20 MG tablet Take 20 mg by mouth 2 (two) times daily.     TURMERIC PO Take by mouth.     No current facility-administered medications on file prior to visit.    Objective:   Vitals:   05/23/23 0801  BP: 108/78  Pulse: 79  SpO2: 97%   Vitals:   05/23/23 0801  Weight: 152 lb 9.6 oz (69.2 kg)  Height: 5' 4 (1.626 m)   Body mass index is 26.19 kg/m.  General: Well Developed, well nourished, and in no acute distress.  Neuro: Alert and oriented x3, extra-ocular muscles intact, sensation grossly intact. Cranial nerves II through XII are grossly intact, motor, sensory, and coordinative functions are intact. HEENT: Normocephalic, atraumatic, neck supple, no masses, no lymphadenopathy, thyroid  nonenlarged. Oropharynx, nasopharynx, external ear canals are unremarkable. Skin: Warm and dry, no rashes noted.  Cardiac: Regular rate  and rhythm, no murmurs rubs or gallops. No peripheral edema. Pulses symmetric. Respiratory: Clear to auscultation bilaterally. Speaking in full sentences.  Abdominal: Soft, nontender, nondistended, positive bowel sounds, no masses, no organomegaly. Musculoskeletal: Stable, and with full range of motion.   Impression and Recommendations:   The patient was counselled, risk factors were discussed, and anticipatory guidance given.  Problem List Items Addressed This Visit       Nervous and Auditory   Carpal tunnel syndrome on left   Carpal Tunnel Syndrome Improvement in symptoms. -Continue to monitor.      Relevant Medications   tirzepatide  (ZEPBOUND ) 5 MG/0.5ML Pen     Musculoskeletal  and Integument   Primary osteoarthritis of hips, bilateral   Hip Pain Mild symptoms, currently managed with exercises and stretches. Discussed options for further intervention if needed. -Continue current management.        Other   RESOLVED: H/O basal cell carcinoma excision   Depression with anxiety   Mental Health Low scores on GAD and PHQ.  Currently seeing telemedicine group, patient expressed interest in referral to local psychiatry group. -Referral to psychiatry for assessment of care, further evaluation and management.      Relevant Orders   Ambulatory referral to Psychiatry   Encounter for weight management   Weight Management Continued weight loss. -Continue current management.      Relevant Medications   tirzepatide  (ZEPBOUND ) 5 MG/0.5ML Pen   Healthcare maintenance - Primary   Annual examination completed, risk stratification labs ordered, anticipatory guidance provided.  We will follow labs once resulted.  General Health Maintenance -Patient due for endoscopy and colonoscopy due this year, already established with GI. -Referral to gynecology for routine establishment of care. -Follow-up in 6 months to a year.      Relevant Orders   Comprehensive metabolic panel   CBC   Hemoglobin A1c   Lipid panel   VITAMIN D  25 Hydroxy (Vit-D Deficiency, Fractures)   TSH   Mixed hyperlipidemia   Improved cholesterol and triglyceride levels. -Continue current management. -Lab recheck.      Relevant Orders   Lipid panel   Other Visit Diagnoses       Vitamin D  deficiency       Relevant Orders   VITAMIN D  25 Hydroxy (Vit-D Deficiency, Fractures)     Screening for diabetes mellitus       Relevant Orders   Hemoglobin A1c     Cervical cancer screening       Relevant Orders   Ambulatory referral to Gynecology     Encounter for screening mammogram for malignant neoplasm of breast       Relevant Orders   MM DIGITAL SCREENING BILATERAL        Orders &  Medications Medications:  Meds ordered this encounter  Medications   tirzepatide  (ZEPBOUND ) 5 MG/0.5ML Pen    Sig: Inject 5 mg into the skin once a week.    Dispense:  2 mL    Refill:  5   Orders Placed This Encounter  Procedures   MM DIGITAL SCREENING BILATERAL   Comprehensive metabolic panel   CBC   Hemoglobin A1c   Lipid panel   VITAMIN D  25 Hydroxy (Vit-D Deficiency, Fractures)   TSH   Ambulatory referral to Psychiatry   Ambulatory referral to Gynecology     Return in about 6 months (around 11/20/2023).    Selinda JINNY Ku, MD, [facility]   Primary Care Sports Medicine Primary Care and Sports Medicine at [facility]  [place]

## 2023-05-23 NOTE — Assessment & Plan Note (Signed)
 Annual examination completed, risk stratification labs ordered, anticipatory guidance provided.  We will follow labs once resulted.  General Health Maintenance -Patient due for endoscopy and colonoscopy due this year, already established with GI. -Referral to gynecology for routine establishment of care. -Follow-up in 6 months to a year.

## 2023-05-23 NOTE — Assessment & Plan Note (Signed)
 Improved cholesterol and triglyceride levels. -Continue current management. -Lab recheck.

## 2023-05-23 NOTE — Assessment & Plan Note (Signed)
 Carpal Tunnel Syndrome Improvement in symptoms. -Continue to monitor.

## 2023-05-24 ENCOUNTER — Encounter: Payer: Self-pay | Admitting: Family Medicine

## 2023-05-24 LAB — COMPREHENSIVE METABOLIC PANEL
ALT: 19 [IU]/L (ref 0–32)
AST: 19 [IU]/L (ref 0–40)
Albumin: 4.4 g/dL (ref 3.9–4.9)
Alkaline Phosphatase: 48 [IU]/L (ref 44–121)
BUN/Creatinine Ratio: 14 (ref 9–23)
BUN: 12 mg/dL (ref 6–24)
Bilirubin Total: 0.3 mg/dL (ref 0.0–1.2)
CO2: 20 mmol/L (ref 20–29)
Calcium: 9.5 mg/dL (ref 8.7–10.2)
Chloride: 106 mmol/L (ref 96–106)
Creatinine, Ser: 0.85 mg/dL (ref 0.57–1.00)
Globulin, Total: 2.5 g/dL (ref 1.5–4.5)
Glucose: 86 mg/dL (ref 70–99)
Potassium: 4.6 mmol/L (ref 3.5–5.2)
Sodium: 141 mmol/L (ref 134–144)
Total Protein: 6.9 g/dL (ref 6.0–8.5)
eGFR: 84 mL/min/{1.73_m2} (ref >59)

## 2023-05-24 LAB — CBC
Hematocrit: 41.5 % (ref 34.0–46.6)
Hemoglobin: 13.3 g/dL (ref 11.1–15.9)
MCH: 29.2 pg (ref 26.6–33.0)
MCHC: 32 g/dL (ref 31.5–35.7)
MCV: 91 fL (ref 79–97)
Platelets: 163 10*3/uL (ref 150–450)
RBC: 4.56 x10E6/uL (ref 3.77–5.28)
RDW: 12.4 % (ref 11.7–15.4)
WBC: 6.8 10*3/uL (ref 3.4–10.8)

## 2023-05-24 LAB — HEMOGLOBIN A1C
Est. average glucose Bld gHb Est-mCnc: 103 mg/dL
Hgb A1c MFr Bld: 5.2 % (ref 4.8–5.6)

## 2023-05-24 LAB — VITAMIN D 25 HYDROXY (VIT D DEFICIENCY, FRACTURES): Vit D, 25-Hydroxy: 49.4 ng/mL (ref 30.0–100.0)

## 2023-05-24 LAB — LIPID PANEL
Chol/HDL Ratio: 3.9 {ratio} (ref 0.0–4.4)
Cholesterol, Total: 197 mg/dL (ref 100–199)
HDL: 51 mg/dL (ref >39)
LDL Chol Calc (NIH): 125 mg/dL — ABNORMAL HIGH (ref 0–99)
Triglycerides: 115 mg/dL (ref 0–149)
VLDL Cholesterol Cal: 21 mg/dL (ref 5–40)

## 2023-05-24 LAB — TSH: TSH: 1.72 u[IU]/mL (ref 0.450–4.500)

## 2023-05-30 ENCOUNTER — Encounter: Payer: Self-pay | Admitting: *Deleted

## 2023-06-06 ENCOUNTER — Encounter: Payer: Self-pay | Admitting: Licensed Practical Nurse

## 2023-06-06 ENCOUNTER — Other Ambulatory Visit (HOSPITAL_COMMUNITY)
Admission: RE | Admit: 2023-06-06 | Discharge: 2023-06-06 | Disposition: A | Discharge status: Home / Self Care | Payer: BC Managed Care – PPO | Source: Ambulatory Visit | Attending: Licensed Practical Nurse | Admitting: Licensed Practical Nurse

## 2023-06-06 ENCOUNTER — Ambulatory Visit: Payer: BC Managed Care – PPO | Admitting: Licensed Practical Nurse

## 2023-06-06 VITALS — BP 114/83 | HR 71 | Ht 64.0 in

## 2023-06-06 DIAGNOSIS — Z124 Encounter for screening for malignant neoplasm of cervix: Secondary | ICD-10-CM | POA: Diagnosis not present

## 2023-06-06 DIAGNOSIS — F418 Other specified anxiety disorders: Secondary | ICD-10-CM

## 2023-06-06 DIAGNOSIS — Z01419 Encounter for gynecological examination (general) (routine) without abnormal findings: Secondary | ICD-10-CM | POA: Insufficient documentation

## 2023-06-06 DIAGNOSIS — M160 Bilateral primary osteoarthritis of hip: Secondary | ICD-10-CM

## 2023-06-06 NOTE — Assessment & Plan Note (Signed)
Some hip discomfort with abduction of BLE with speculum exam. Modified body position for comfort during exam.

## 2023-06-06 NOTE — Assessment & Plan Note (Addendum)
Stable on current dose of buspirone and escitalopram. Seeing an online psychiatrist who manages medications. She is working with PCP to establish care with a local psychiatry practice; referral has been placed by PCP, she is just awaiting visit. PHQ-9 is 2 at today's visit.

## 2023-06-06 NOTE — Progress Notes (Signed)
Outpatient Gynecology Note: Annual Visit  Assessment/Plan:    Alexandra Salinas is a 48 y.o. female [phone] with normal well-woman gynecologic exam.    Depression with anxiety Stable on current dose of buspirone and escitalopram. Seeing an online psychiatrist who manages medications. She is working with PCP to establish care with a local psychiatry practice; referral has been placed by PCP, she is just awaiting visit. PHQ-9 is 2 at today's visit.   Primary osteoarthritis of hips, bilateral Some hip discomfort with abduction of BLE with speculum exam. Modified body position for comfort during exam.   Pap and pelvic exam done today.  Discussed RTC in one year for well-woman exam.  Will order mammogram.  Continue with routine health screenings.    Risk factors identified in Subjective to review: none No orders of the defined types were placed in this encounter.  Current Outpatient Medications  Medication Instructions   busPIRone (BUSPAR) 5 mg, 2 times daily   escitalopram (LEXAPRO) 20 mg, Daily   famotidine (PEPCID) 20 mg, 2 times daily   TURMERIC PO Take by mouth.   Zepbound 5 mg, Subcutaneous, Weekly    No follow-ups on file.    Subjective:    Alexandra Salinas is a 48 y.o. female [id number] who presents for annual wellness visit. She is here to establish care. She reports she is feeling well overall, and feels her health is good overall. When asked about specific health concerns, she mentioned she is working with her PCP Dr. Ashley Royalty at [facility] in [place] to lose weight with tirzepatide injections, and is happy with the progress she has made. She mentions a family history of colon cancer and breast cancer.   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    Lives with husband and 22 y.o. daughter.      CONCERNS? none  Well Woman Visit:  GYN HISTORY:  Patient's last menstrual period was 05/23/2023 (exact date).      OB History     Gravida  3    Para      Term      Preterm      AB  2   Living  1      SAB  2   IAB      Ectopic      Multiple      Live Births  1        Obstetric Comments  C-section x 1         Menarche age: 77 Patient's last menstrual period was 05/23/2023 (exact date). Period Cycle (Days): 28 Period Duration (Days): 5 Period Pattern: Regular Menstrual Flow: Moderate Menstrual Control: Maxi pad, Tampon Menstrual Control Change Freq (Hours): 4-8 Dysmenorrhea: (!) Mild Dysmenorrhea Symptoms: Cramping, Diarrhea   Periods are regular- every 28 days and last 5 days. Dysmenorrhea: mild. Cyclic symptoms include: diarrhea and cramping .  Intermenstrual bleeding, spotting, or discharge? none Urinary incontinence? none  Sexually active: yes Number of sexual partners: 1 Gender of sexual Partners: male Dyspareunia? None reported Last pap: 2022, NILM, High Risk HPV negative History of abnormal Pap: no Gardasil series:  unknown STI history: none STI/HIV testing or immunizations needed? declined Contraceptive methods: no method. Discussed that since she is still having regular cycles, possibility of pregnancy may still exist.   Health Maintenance > Reviewed breast self-awareness > History of abnormal mammogram: No > Exercise: aerobics and weightlifting, goes to gym 4-5x weekly, very active > Dietary Supplements: Folate: No;  Calcium:  No}; Vitamin D: No > Body mass index is 26.19 kg/m.  > Recent dental visit Yes.   > Concern for alcohol abuse? no  > Stress level: low-medium Tobacco or other drug use: denied. Tobacco Use: Medium Risk (06/06/2023)   Patient History    Smoking Tobacco Use: Former    Smokeless Tobacco Use: Never    Passive Exposure: Not on file     If >40:  Last mammogram: No evidence of malignancy Age at menopause: n/a  Last colonoscopy: 2020 Last DEXA: n/a Last lipid screening: 05/23/2023  _________________________________________________________  Current  Outpatient Medications  Medication Sig Dispense Refill   busPIRone (BUSPAR) 5 MG tablet Take 5 mg by mouth 2 (two) times daily.     escitalopram (LEXAPRO) 20 MG tablet Take 20 mg by mouth daily.     famotidine (PEPCID) 20 MG tablet Take 20 mg by mouth 2 (two) times daily.     tirzepatide (ZEPBOUND) 5 MG/0.5ML Pen Inject 5 mg into the skin once a week. 2 mL 5   TURMERIC PO Take by mouth.     No current facility-administered medications for this visit.   No Known Allergies  Past Medical History:  Diagnosis Date   Anxiety    Depression    Diverticulitis    GERD (gastroesophageal reflux disease)    H/O basal cell carcinoma excision 05/23/2023   Past Surgical History:  Procedure Laterality Date   BASAL CELL CARCINOMA EXCISION  10/2021   forehead   CESAREAN SECTION  2021   COLONOSCOPY  2020   ESOPHAGOGASTRODUODENOSCOPY  11/2020   REFRACTIVE SURGERY Bilateral 2015   OB History     Gravida  3   Para      Term      Preterm      AB  2   Living  1      SAB  2   IAB      Ectopic      Multiple      Live Births  1        Obstetric Comments  C-section x 1        Social History   Tobacco Use   Smoking status: Former    Current packs/day: 0.00    Average packs/day: 1 pack/day for 20.0 years (20.0 ttl pk-yrs)    Types: Cigarettes    Start date: 4    Quit date: 2017    Years since quitting: 8.0   Smokeless tobacco: Never  Substance Use Topics   Alcohol use: Yes    Alcohol/week: 5.0 standard drinks of alcohol    Types: 5 Standard drinks or equivalent per week    Comment: occ   Social History   Substance and Sexual Activity  Sexual Activity Yes   Partners: Male   Family History Father: colon cancer Paternal grandmother: breast cancer later in life  There is no immunization history on file for this patient.   Review Of Systems  Constitutional: Denied constitutional symptoms, night sweats, recent illness, fatigue, fever, insomnia and weight  loss.  Eyes: Denied eye symptoms, eye pain, photophobia, vision change and visual disturbance.  Ears/Nose/Throat/Neck: Denied ear, nose, throat or neck symptoms, hearing loss, nasal discharge, sinus congestion and sore throat.  Cardiovascular: Denied cardiovascular symptoms, arrhythmia, chest pain/pressure, edema, exercise intolerance, orthopnea and palpitations.  Respiratory: Denied pulmonary symptoms, asthma, pleuritic pain, productive sputum, cough, dyspnea and wheezing.  Gastrointestinal: Denied, gastro-esophageal reflux, melena, nausea and vomiting.  Genitourinary: Denied genitourinary symptoms including symptomatic vaginal  discharge, pelvic relaxation issues, and urinary complaints.  Musculoskeletal: Denied musculoskeletal swelling, muscle weakness and myalgia. Mild intermittent stiffness and pain in hips.  Dermatologic: Denied dermatology symptoms, rash and scar.  Neurologic: Denied neurology symptoms, dizziness, headache, neck pain and syncope.  Psychiatric: Denied psychiatric symptoms, anxiety and depression.  Endocrine: Denied endocrine symptoms including hot flashes and night sweats.      Objective:    BP 114/83   Pulse 71   Ht 5\' 4"  (1.626 m)   LMP 05/23/2023 (Exact Date)   BMI 26.19 kg/m   Constitutional: Well-developed, well-nourished female in no acute distress Neurological: Alert and oriented to person, place, and time Psychiatric: Mood and affect appropriate Skin: No rashes or lesions Neck: Supple without masses. Trachea is midline.Thyroid is normal size without masses Lymphatics: No cervical or axillary lymphadenopathy. Respiratory: Clear to auscultation bilaterally. Good air movement with normal work of breathing. Cardiovascular: Regular rate and rhythm. Extremities grossly normal, nontender with no edema; pulses regular Gastrointestinal: Soft, nontender, nondistended. No masses or hernias appreciated. No hepatosplenomegaly. No fluid wave. No rebound or  guarding. Breast Exam: normal appearance, no masses or tenderness Genitourinary:         External Genitalia: Normal female genitalia    Vagina: no lesions. Well rugated.    Cervix: No lesions, normal size and consistency; no cervical motion tenderness     Uterus: Normal size and contour; smooth, mobile, NT, midposition. Adnexae: Non-palpable and non-tender Perineum/Anus: No lesions Rectal: deferred    Cindra Eves, SNM Carie Caddy, CNM present for all portions of care.   06/06/2023 12:42 pm

## 2023-06-10 LAB — CYTOLOGY - PAP
Comment: NEGATIVE
Diagnosis: UNDETERMINED — AB
High risk HPV: NEGATIVE

## 2023-06-11 ENCOUNTER — Encounter: Payer: Self-pay | Admitting: Licensed Practical Nurse

## 2023-06-21 ENCOUNTER — Ambulatory Visit: Payer: BC Managed Care – PPO | Admitting: Family Medicine

## 2023-08-14 IMAGING — MG MM DIGITAL SCREENING BILAT W/ TOMO AND CAD
6 of 10 series · 6 of 30 positions shown · non-contrast
Comparison: Previous exam(s).

CLINICAL DATA: Screening.

EXAM:
DIGITAL SCREENING BILATERAL MAMMOGRAM WITH TOMOSYNTHESIS AND CAD
TECHNIQUE: Bilateral screening digital craniocaudal and mediolateral oblique
mammograms were obtained. Bilateral screening digital breast
tomosynthesis was performed. The images were evaluated with
computer-aided detection.

[L CC synth-2D]
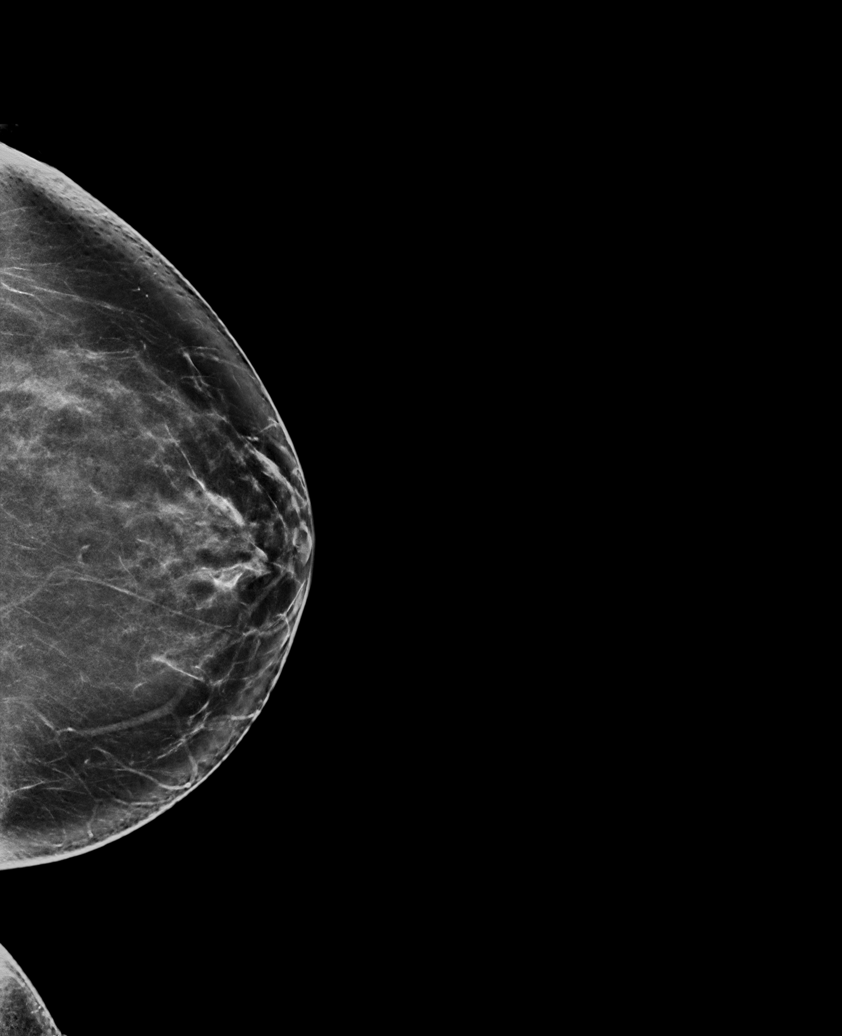

[R MLO synth-2D]
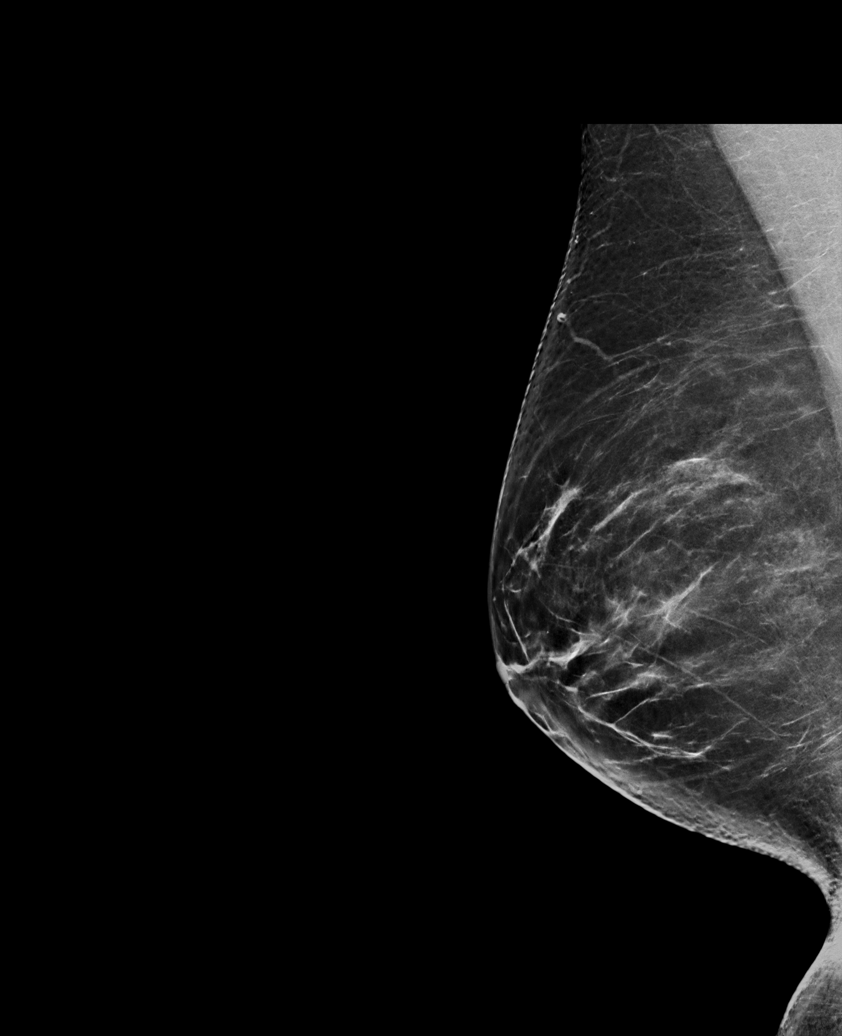

[L XCCL synth-2D]
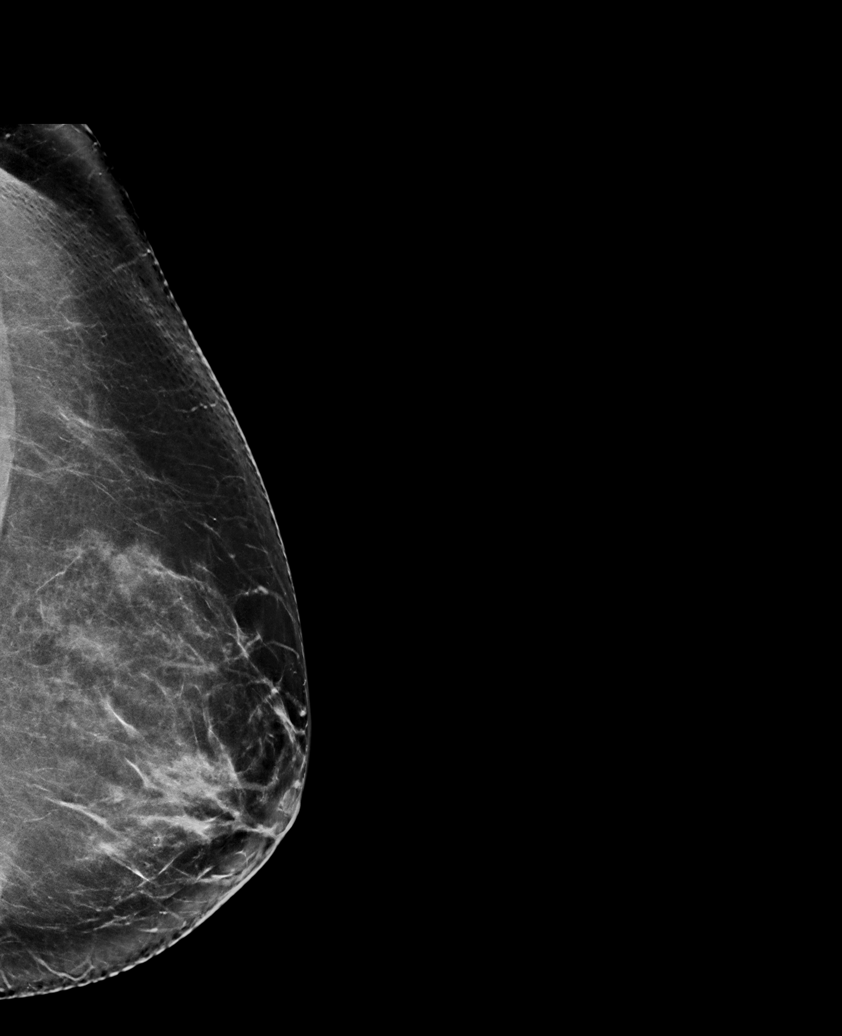

[L MLO synth-2D]
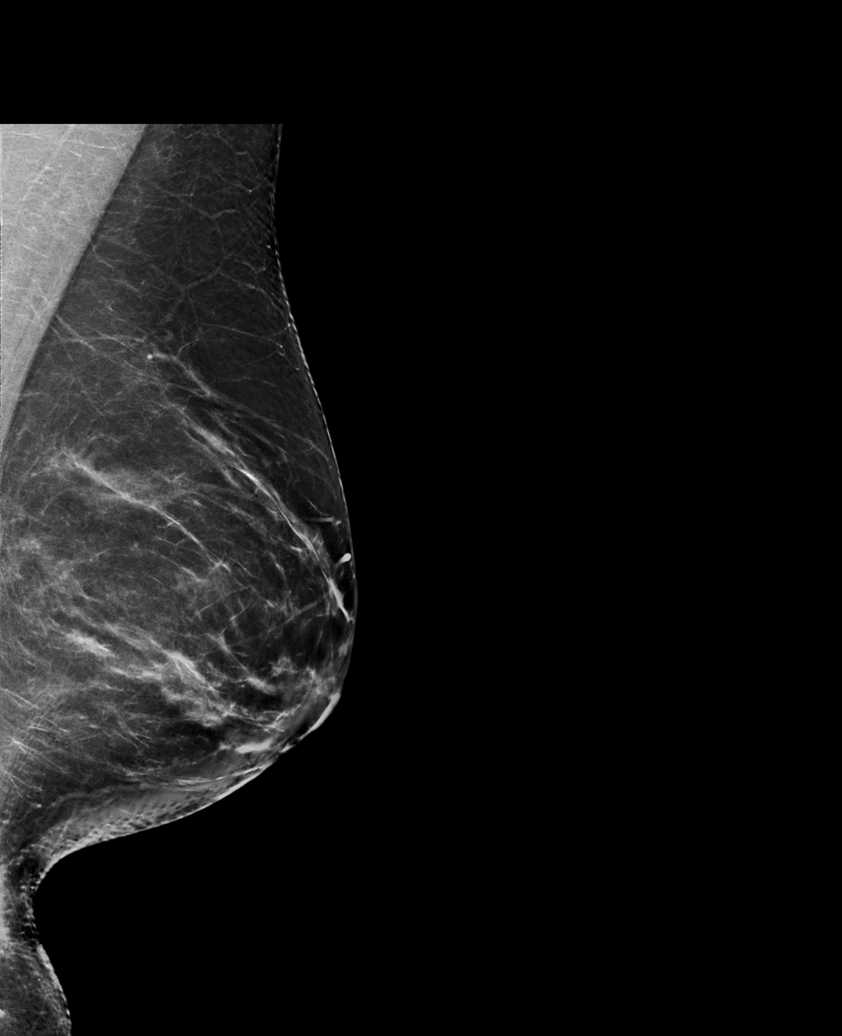

[R CC synth-2D]
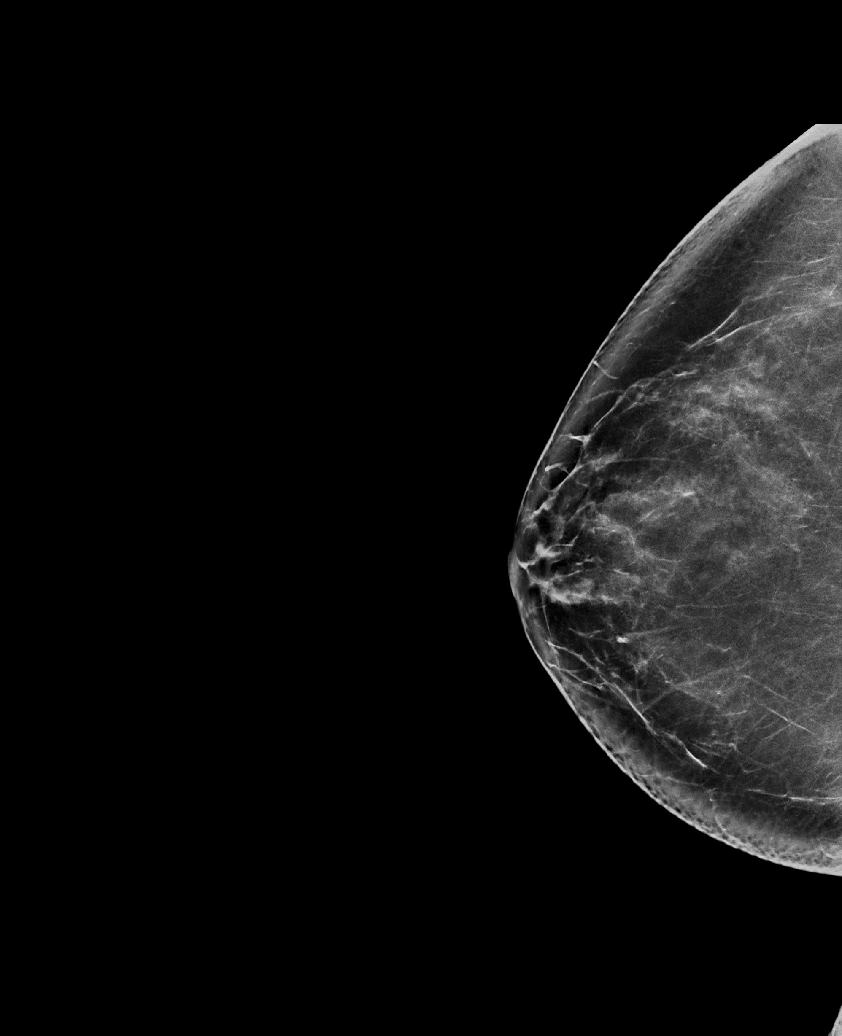

[L MLO tomo · tomo slice 46/91.0]
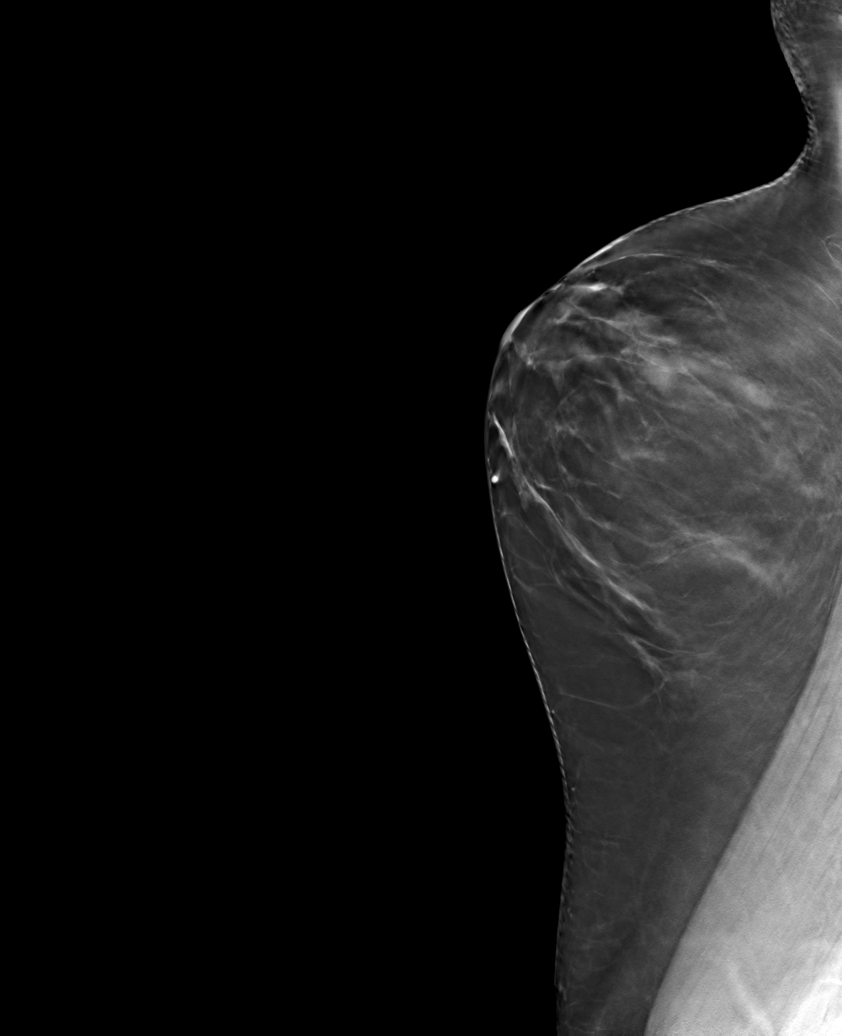

[6 of 30 positions shown; findings below may reference images not displayed]

ACR Breast Density Category b: There are scattered areas of
fibroglandular density.
FINDINGS: There are no findings suspicious for malignancy.
IMPRESSION: No mammographic evidence of malignancy. A result letter of this
screening mammogram will be mailed directly to the patient.

RECOMMENDATION:
Screening mammogram in one year. (Code:51-O-LD2)

BI-RADS CATEGORY  1: Negative.

## 2023-08-30 ENCOUNTER — Telehealth: Payer: Self-pay

## 2023-08-30 DIAGNOSIS — E669 Obesity, unspecified: Secondary | ICD-10-CM | POA: Insufficient documentation

## 2023-08-30 DIAGNOSIS — Z6834 Body mass index (BMI) 34.0-34.9, adult: Secondary | ICD-10-CM | POA: Insufficient documentation

## 2023-08-30 NOTE — Telephone Encounter (Signed)
 PA completed waiting on insurance approval.  Key: [id number]  [id number]

## 2023-09-03 ENCOUNTER — Ambulatory Visit (INDEPENDENT_AMBULATORY_CARE_PROVIDER_SITE_OTHER): Payer: BC Managed Care – PPO | Admitting: Dermatology

## 2023-09-03 ENCOUNTER — Encounter: Payer: Self-pay | Admitting: Dermatology

## 2023-09-03 DIAGNOSIS — L578 Other skin changes due to chronic exposure to nonionizing radiation: Secondary | ICD-10-CM

## 2023-09-03 DIAGNOSIS — Z85828 Personal history of other malignant neoplasm of skin: Secondary | ICD-10-CM

## 2023-09-03 DIAGNOSIS — D2372 Other benign neoplasm of skin of left lower limb, including hip: Secondary | ICD-10-CM

## 2023-09-03 DIAGNOSIS — L700 Acne vulgaris: Secondary | ICD-10-CM

## 2023-09-03 DIAGNOSIS — D239 Other benign neoplasm of skin, unspecified: Secondary | ICD-10-CM

## 2023-09-03 DIAGNOSIS — L821 Other seborrheic keratosis: Secondary | ICD-10-CM

## 2023-09-03 DIAGNOSIS — Z1283 Encounter for screening for malignant neoplasm of skin: Secondary | ICD-10-CM

## 2023-09-03 DIAGNOSIS — W908XXA Exposure to other nonionizing radiation, initial encounter: Secondary | ICD-10-CM | POA: Diagnosis not present

## 2023-09-03 DIAGNOSIS — L814 Other melanin hyperpigmentation: Secondary | ICD-10-CM

## 2023-09-03 DIAGNOSIS — D1801 Hemangioma of skin and subcutaneous tissue: Secondary | ICD-10-CM

## 2023-09-03 DIAGNOSIS — D229 Melanocytic nevi, unspecified: Secondary | ICD-10-CM

## 2023-09-03 NOTE — Patient Instructions (Addendum)
Discussed cosmetic procedure (cryotherapy), noncovered.  $60 for 1st lesion and $15 for each additional lesion if done on the same day.  Maximum charge $350.  One touch-up treatment included no charge. Discussed risks of treatment including dyspigmentation, small scar, and/or recurrence. Recommend daily broad spectrum sunscreen SPF 30+/photoprotection to treated areas once healed.   Due to recent changes in healthcare laws, you may see results of your pathology and/or laboratory studies on MyChart before the doctors have had a chance to review them. We understand that in some cases there may be results that are confusing or concerning to you. Please understand that not all results are received at the same time and often the doctors may need to interpret multiple results in order to provide you with the best plan of care or course of treatment. Therefore, we ask that you please give [place] 2 business days to thoroughly review all your results before contacting the office for clarification. Should we see a critical lab result, you will be contacted sooner.   If You Need Anything After Your Visit  If you have any questions or concerns for your doctor, please call our main line at [phone] and press option 4 to reach your doctor's medical assistant. If no one answers, please leave a voicemail as directed and we will return your call as soon as possible. Messages left after 4 pm will be answered the following business day.   You may also send [place] a message via MyChart. We typically respond to MyChart messages within 1-2 business days.  For prescription refills, please ask your pharmacy to contact our office. Our fax number is [phone].  If you have an urgent issue when the clinic is closed that cannot wait until the next business day, you can page your doctor at the number below.    Please note that while we do our best to be available for urgent issues outside of office hours, we are not available  24/7.   If you have an urgent issue and are unable to reach [place], you may choose to seek medical care at your doctor's office, retail clinic, urgent care center, or emergency room.  If you have a medical emergency, please immediately call 911 or go to the emergency department.  Pager Numbers  - Dr. Gwen Pounds: [phone]  - Dr. Roseanne Reno: [phone]  - Dr. Katrinka Blazing: [phone]   In the event of inclement weather, please call our main line at [phone] for an update on the status of any delays or closures.  Dermatology Medication Tips: Please keep the boxes that topical medications come in in order to help keep track of the instructions about where and how to use these. Pharmacies typically print the medication instructions only on the boxes and not directly on the medication tubes.   If your medication is too expensive, please contact our office at [phone] option 4 or send [place] a message through MyChart.   We are unable to tell what your co-pay for medications will be in advance as this is different depending on your insurance coverage. However, we may be able to find a substitute medication at lower cost or fill out paperwork to get insurance to cover a needed medication.   If a prior authorization is required to get your medication covered by your insurance company, please allow [place] 1-2 business days to complete this process.  Drug prices often vary depending on where the prescription is filled and some pharmacies may offer cheaper prices.  The website  www.goodrx.com contains coupons for medications through different pharmacies. The prices here do not account for what the cost may be with help from insurance (it may be cheaper with your insurance), but the website can give you the price if you did not use any insurance.  - You can print the associated coupon and take it with your prescription to the pharmacy.  - You may also stop by our office during regular business hours and pick  up a GoodRx coupon card.  - If you need your prescription sent electronically to a different pharmacy, notify our office through [facility] or by phone at [phone] option 4.     Si Usted Necesita Algo Despus de Su Visita  Tambin puede enviarnos un mensaje a travs de Clinical cytogeneticist. Por lo general respondemos a los mensajes de MyChart en el transcurso de 1 a 2 das hbiles.  Para renovar recetas, por favor pida a su farmacia que se ponga en contacto con nuestra oficina. Annie Sable de fax es [place] [phone].  Si tiene un asunto urgente cuando la clnica est cerrada y que no puede esperar hasta el siguiente da hbil, puede llamar/localizar a su doctor(a) al nmero que aparece a continuacin.   Por favor, tenga en cuenta que aunque hacemos todo lo posible para estar disponibles para asuntos urgentes fuera del horario de [place], no estamos disponibles las 24 horas del da, los 7 [address]  [address] de la [place].   Si tiene un problema urgente y no puede comunicarse con nosotros, puede optar por buscar atencin mdica  en el consultorio de su doctor(a), en una clnica privada, en un centro de atencin urgente o en una sala de emergencias.  Si tiene Engineer, drilling, por favor llame inmediatamente al 911 o vaya a la sala de emergencias.  Nmeros de bper  - Dr. Gwen Pounds: [phone]  - Dra. Roseanne Reno: [phone]  - Dr. Katrinka Blazing: [phone]   En caso de inclemencias del tiempo, por favor llame a Lacy Duverney principal al [phone] para una actualizacin sobre el [place] de cualquier retraso o cierre.  Consejos para la medicacin en dermatologa: Por favor, guarde las cajas en las que vienen los medicamentos de uso tpico para ayudarle a seguir las instrucciones sobre dnde y cmo usarlos. Las farmacias generalmente imprimen las instrucciones del medicamento slo en las cajas y no directamente en los tubos del [place].   Si su medicamento es muy caro, por favor, pngase en  contacto con Rolm Gala llamando al [phone] y presione la opcin 4 o envenos un mensaje a travs de Clinical cytogeneticist.   No podemos decirle cul ser su copago por los medicamentos por adelantado ya que esto es diferente dependiendo de la cobertura de su seguro. Sin embargo, es posible que podamos encontrar un medicamento sustituto a Audiological scientist un formulario para que el seguro cubra el medicamento que se considera necesario.   Si se requiere una autorizacin previa para que su compaa de seguros [place] su medicamento, por favor permtanos de 1 a 2 das hbiles para completar [address]  Los precios de los medicamentos varan con frecuencia dependiendo del Environmental consultant de dnde se surte la receta y alguna farmacias pueden ofrecer precios ms baratos.  El sitio web www.goodrx.com tiene cupones para medicamentos de Health and safety inspector. Los precios aqu no tienen en cuenta lo que podra costar con la ayuda del seguro (puede ser ms barato con su seguro), pero el sitio web puede darle el precio si no utiliz Tourist information centre manager.  - Puede  imprimir el cupn correspondiente y llevarlo con su receta a la farmacia.  - Tambin puede pasar por nuestra oficina durante el horario de atencin regular y Education officer, museum una tarjeta de cupones de GoodRx.  - Si necesita que su receta se enve electrnicamente a una farmacia diferente, informe a nuestra oficina a travs de MyChart de [place] o por telfono llamando al [phone] y presione la opcin 4.

## 2023-09-03 NOTE — Progress Notes (Signed)
 New Patient Visit   Subjective  Alexandra Salinas is a 48 y.o. female who presents for the following: Skin Cancer Screening and Full Body Skin Exam  The patient presents for Total-Body Skin Exam (TBSE) for skin cancer screening and mole check. The patient has spots, moles and lesions to be evaluated, some may be new or changing. She has a history of BCC of the forehead treated in 2023 with Mohs. Firm bump on the left lateral lower leg that was noticed last year.    The following portions of the chart were reviewed this encounter and updated as appropriate: medications, allergies, medical history  Review of Systems:  No other skin or systemic complaints except as noted in HPI or Assessment and Plan.  Objective  Well appearing patient in no apparent distress; mood and affect are within normal limits.  A full examination was performed including scalp, head, eyes, ears, nose, lips, neck, chest, axillae, abdomen, back, buttocks, bilateral upper extremities, bilateral lower extremities, hands, feet, fingers, toes, fingernails, and toenails. All findings within normal limits unless otherwise noted below.   Relevant physical exam findings are noted in the Assessment and Plan.  Left Dorsal Hand (2) Stuck-on, waxy, tan-brown papule or plaque --Discussed benign etiology and prognosis.    Assessment & Plan   SKIN CANCER SCREENING PERFORMED TODAY.  ACTINIC DAMAGE - Chronic condition, secondary to cumulative UV/sun exposure - diffuse scaly erythematous macules with underlying dyspigmentation - Recommend daily broad spectrum sunscreen SPF 30+ to sun-exposed areas, reapply every 2 hours as needed.  - Staying in the shade or wearing long sleeves, sun glasses (UVA+UVB protection) and wide brim hats (4-inch brim around the entire circumference of the hat) are also recommended for sun protection.  - Call for new or changing lesions.  LENTIGINES, SEBORRHEIC KERATOSES, HEMANGIOMAS - Benign  normal skin lesions - Benign-appearing - Call for any changes  MELANOCYTIC NEVI - Tan-brown and/or pink-flesh-colored symmetric macules and papules - Benign appearing on exam today - Observation - Call clinic for new or changing moles - Recommend daily use of broad spectrum spf 30+ sunscreen to sun-exposed areas.   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  Chronic and persistent condition with duration or expected duration over one year. Condition is not bothersome/symptomatic for patient.  Treatment Plan: Recommend otc benzoyl peroxide spot treatment.  Benzoyl peroxide can cause dryness and irritation of the skin. It can also bleach fabric. When used together with Aczone (dapsone) cream, it can stain the skin orange.   DERMATOFIBROMA Exam: Firm pink/brown papulenodule with dimple sign at left lateral lower leg.  Treatment Plan: A dermatofibroma is a benign growth possibly related to trauma, such as an insect bite, cut from shaving, or inflamed acne-type bump.  Treatment options to remove include shave or excision with resulting scar and risk of recurrence.  Since benign-appearing and not bothersome, will observe for now.   SEBORRHEIC KERATOSIS (2) Left Dorsal Hand (2) Reassured benign age-related growth.  Discussed cosmetic procedure cryotherapy, noncovered.  $60 for 1st lesion and $15 for each additional lesion if done on the same day.  Maximum charge $350.  One touch-up treatment included  no charge. Discussed risks of treatment including dyspigmentation, small scar, and/or recurrence. Recommend daily broad spectrum sunscreen SPF  30+/photoprotection to treated areas once healed.  Charge for two lesions- $75 (Three Sks treated but two tiny lesions counted as one, see photo)  Destruction of lesion - Left Dorsal Hand (2)  Destruction method: cryotherapy   Informed consent: discussed and consent obtained   Lesion destroyed using liquid nitrogen: Yes   Region frozen until ice ball extended beyond lesion: Yes   Outcome: patient tolerated procedure well with no complications   Post-procedure details: wound care instructions given   Additional details:  Prior to procedure, discussed risks of blister formation, small wound, skin dyspigmentation, or rare scar following cryotherapy. Recommend Vaseline ointment to treated areas while healing.   Return in about 1 year (around 09/02/2024) for TBSE.  IBernardine Bridegroom, CMA, am acting as scribe for Artemio Larry, MD .   Documentation: I have reviewed the above documentation for accuracy and completeness, and I agree with the above.  Artemio Larry, MD

## 2023-09-05 NOTE — Telephone Encounter (Signed)
 Your request has been approved. Authorization Expiration Date: April 04, 2024.  KP

## 2023-09-16 ENCOUNTER — Ambulatory Visit
Admission: RE | Admit: 2023-09-16 | Discharge: 2023-09-16 | Disposition: A | Discharge status: Home / Self Care | Source: Ambulatory Visit | Attending: Family Medicine | Admitting: Family Medicine

## 2023-09-16 DIAGNOSIS — Z1231 Encounter for screening mammogram for malignant neoplasm of breast: Secondary | ICD-10-CM | POA: Diagnosis not present

## 2023-10-07 ENCOUNTER — Encounter: Payer: Self-pay | Admitting: Psychiatry

## 2023-10-07 ENCOUNTER — Other Ambulatory Visit: Payer: Self-pay

## 2023-10-07 ENCOUNTER — Ambulatory Visit: Admitting: Psychiatry

## 2023-10-07 VITALS — BP 113/75 | HR 74 | Temp 98.4°F | Wt 141.0 lb

## 2023-10-07 DIAGNOSIS — F418 Other specified anxiety disorders: Secondary | ICD-10-CM

## 2023-10-07 DIAGNOSIS — F3342 Major depressive disorder, recurrent, in full remission: Secondary | ICD-10-CM | POA: Diagnosis not present
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
## 2023-10-30 ENCOUNTER — Encounter: Payer: Self-pay | Admitting: Family Medicine

## 2023-10-30 ENCOUNTER — Ambulatory Visit (INDEPENDENT_AMBULATORY_CARE_PROVIDER_SITE_OTHER): Admitting: Family Medicine

## 2023-10-30 VITALS — BP 100/64 | HR 88 | Ht 64.0 in | Wt 139.0 lb

## 2023-10-30 DIAGNOSIS — M160 Bilateral primary osteoarthritis of hip: Secondary | ICD-10-CM

## 2023-10-30 DIAGNOSIS — E669 Obesity, unspecified: Secondary | ICD-10-CM | POA: Diagnosis not present

## 2023-10-30 DIAGNOSIS — Z1211 Encounter for screening for malignant neoplasm of colon: Secondary | ICD-10-CM | POA: Diagnosis not present

## 2023-10-30 DIAGNOSIS — G5602 Carpal tunnel syndrome, left upper limb: Secondary | ICD-10-CM | POA: Diagnosis not present

## 2023-10-30 DIAGNOSIS — F418 Other specified anxiety disorders: Secondary | ICD-10-CM

## 2023-10-30 DIAGNOSIS — N951 Menopausal and female climacteric states: Secondary | ICD-10-CM

## 2023-10-30 MED ORDER — TIRZEPATIDE-WEIGHT MANAGEMENT 2.5 MG/0.5ML ~~LOC~~ SOLN: 2.5000 mg | SUBCUTANEOUS | 11 refills | Status: DC

## 2023-10-30 NOTE — Assessment & Plan Note (Signed)
 Her psychiatric care includes medication management with no recent changes, and she reports satisfaction with her current regimen. She has had recent routine exams, including a well-woman exam and a mammogram, both of which were normal.

## 2023-10-30 NOTE — Assessment & Plan Note (Signed)
   She has experienced significant weight loss over the past year, returning to her pre-pregnancy weight, attributed to the use of Zepbound  at a dose of 5 mg. This medication has helped reduce 'food noise' and improve control over eating habits. She wants to taper off this medication.  Obesity management Weight decreased from 152 lbs to 139 lbs over interval visit, BMI 23.86. Attributed to lifestyle changes and Zepbound , which reduced food-related anxiety and hunger. - Taper Zepbound  from 5 mg to 2.5 mg. - Consider every other week dosing of 5 mg until new prescription is processed. - Encourage maintenance of current diet and exercise regimen.

## 2023-10-30 NOTE — Assessment & Plan Note (Signed)
 She describes intermittent hip pain that occurs sporadically, typically resolving within five minutes without the need for medication. She manages this with turmeric supplements and has not required additional over-the-counter medications.  Hip pain Intermittent hip pain managed with turmeric, no significant impact on activities. - Continue turmeric supplementation. - Provide list of joint health supplements for research: glucosamine, chondroitin, MSM, omega-3s, collagen, boswellia.

## 2023-10-30 NOTE — Assessment & Plan Note (Signed)
 She has a history of left-sided carpal tunnel syndrome, which has resolved over the past eight to nine months. Previously, she experienced numbness and 'pins and needles' sensations while driving, but these symptoms have not recurred.

## 2023-10-30 NOTE — Assessment & Plan Note (Signed)
 She is undergoing treatment for perimenopausal symptoms with estrogen and progesterone, which has significantly reduced her night sweats and hot flashes. Her mother had a full hysterectomy at age 50, limiting her knowledge of familial menopause patterns.  Perimenopausal symptoms Symptoms include night sweats associated with hormonal changes, improved with estrogen and progesterone therapy. - Continue hormone therapy with estrogen and progesterone. - Monitor symptoms and communicate changes to gynecology group.

## 2023-10-30 NOTE — Progress Notes (Signed)
 Primary Care / Sports Medicine Office Visit  Patient Information:  Patient ID: Alexandra Salinas, female DOB: 1975/12/16 Age: 48 y.o. MRN: [id number]   Alexandra Salinas is a pleasant 48 y.o. female presenting with the following:  Chief Complaint  Patient presents with   Medical Management of Chronic Issues    Patient presents today for a 6 month follow up. She is doing well today. She does not have any concerns today.     Vitals:   10/30/23 0846  Pulse: 88  SpO2: 95%   Vitals:   10/30/23 0846  Weight: 139 lb (63 kg)  Height: 5' 4 (1.626 m)   Body mass index is 23.86 kg/m.  No results found.   Independent interpretation of notes and tests performed by another provider:   None  Procedures performed:   None  Pertinent History, Exam, Impression, and Recommendations:   Problem List Items Addressed This Visit     Carpal tunnel syndrome on left   She has a history of left-sided carpal tunnel syndrome, which has resolved over the past eight to nine months. Previously, she experienced numbness and 'pins and needles' sensations while driving, but these symptoms have not recurred.      Relevant Medications   tirzepatide  (ZEPBOUND ) 2.5 MG/0.5ML injection vial   Depression with anxiety   Her psychiatric care includes medication management with no recent changes, and she reports satisfaction with her current regimen. She has had recent routine exams, including a well-woman exam and a mammogram, both of which were normal.      Obesity with serious comorbidity - Primary     She has experienced significant weight loss over the past year, returning to her pre-pregnancy weight, attributed to the use of Zepbound  at a dose of 5 mg. This medication has helped reduce 'food noise' and improve control over eating habits. She wants to taper off this medication.  Obesity management Weight decreased from 152 lbs to 139 lbs over interval visit, BMI 23.86. Attributed to  lifestyle changes and Zepbound , which reduced food-related anxiety and hunger. - Taper Zepbound  from 5 mg to 2.5 mg. - Consider every other week dosing of 5 mg until new prescription is processed. - Encourage maintenance of current diet and exercise regimen.      Relevant Medications   tirzepatide  (ZEPBOUND ) 2.5 MG/0.5ML injection vial   Perimenopausal symptoms   She is undergoing treatment for perimenopausal symptoms with estrogen and progesterone, which has significantly reduced her night sweats and hot flashes. Her mother had a full hysterectomy at age 23, limiting her knowledge of familial menopause patterns.  Perimenopausal symptoms Symptoms include night sweats associated with hormonal changes, improved with estrogen and progesterone therapy. - Continue hormone therapy with estrogen and progesterone. - Monitor symptoms and communicate changes to gynecology group.      Primary osteoarthritis of hips, bilateral   She describes intermittent hip pain that occurs sporadically, typically resolving within five minutes without the need for medication. She manages this with turmeric supplements and has not required additional over-the-counter medications.  Hip pain Intermittent hip pain managed with turmeric, no significant impact on activities. - Continue turmeric supplementation. - Provide list of joint health supplements for research: glucosamine, chondroitin, MSM, omega-3s, collagen, boswellia.      Other Visit Diagnoses       Screening for colon cancer       Relevant Orders   Ambulatory referral to Gastroenterology        Orders & Medications  Medications:  Meds ordered this encounter  Medications   tirzepatide  (ZEPBOUND ) 2.5 MG/0.5ML injection vial    Sig: Inject 2.5 mg into the skin once a week.    Dispense:  0.5 mL    Refill:  11   Orders Placed This Encounter  Procedures   Ambulatory referral to Gastroenterology     Return for CPE.     Ma Saupe, MD,  [facility]   Primary Care Sports Medicine Primary Care and Sports Medicine at [facility]

## 2023-10-30 NOTE — Patient Instructions (Addendum)
 Patient Action Plan  Carpal Tunnel Syndrome: - No current action needed as symptoms have resolved. Monitor for any recurrence of numbness or 'pins and needles' sensations.  Obesity Management: - Gradually reduce Zepbound  dosage from 5 mg to 2.5 mg. - Consider dosing 5 mg every other week until the new prescription is available. - Maintain current diet and exercise habits to support weight management.  Perimenopausal Symptoms: - Continue hormone therapy with estrogen and progesterone. - Monitor symptoms and report any changes to your gynecologist.  Hip Pain: - Continue taking turmeric supplements for hip pain management. - Consider researching joint health supplements (see separate MyChart message about this).  Depression with Anxiety: - Continue current medication regimen as it is satisfactory and requires no changes.  Screening: - Follow up with the gastroenterology referral.  Red Flags: - If any symptoms related to carpal tunnel syndrome, perimenopausal changes, or hip pain worsen or become more frequent, contact your healthcare provider for further evaluation.

## 2023-11-01 ENCOUNTER — Other Ambulatory Visit: Payer: Self-pay

## 2023-11-01 ENCOUNTER — Telehealth: Payer: Self-pay

## 2023-11-01 DIAGNOSIS — K22719 Barrett's esophagus with dysplasia, unspecified: Secondary | ICD-10-CM

## 2023-11-01 DIAGNOSIS — Z800 Family history of malignant neoplasm of digestive organs: Secondary | ICD-10-CM

## 2023-11-01 MED ORDER — NA SULFATE-K SULFATE-MG SULF 17.5-3.13-1.6 GM/177ML PO SOLN: 1.0000 | Freq: Once | ORAL | 0 refills | Status: AC

## 2023-11-01 NOTE — Telephone Encounter (Signed)
 Gastroenterology Pre-Procedure Review  Request Date: 12/09/23 Requesting Physician: Dr. Ole Berkeley  PATIENT REVIEW QUESTIONS: The patient responded to the following health history questions as indicated:    (Scheduled Colonoscopy w/EGD)   1. Are you having any GI issues? Acid reflux managed with Famotodine.  Barretts esophagus noted on 06/26/21 office visit notes with Dr. Ole Berkeley. Per chart note, Alexandra Salinas is a 48 y.o. y/o female who comes in today with a history of Barrett's esophagus with a repeat upper endoscopy recommended in 2025.  The patient also had a colonoscopy in 2020 and and was reported to have a repeat requested in 2025.  The patient has been told that she will be set up for both procedures in 2025.  2. Do you have a personal history of Polyps? no 3. Do you have a family history of Colon Cancer or Polyps? yes (father colon cancer) 4. Diabetes Mellitus? no 5. Joint replacements in the past 12 months?no 6. Major health problems in the past 3 months?no 7. Any artificial heart valves, MVP, or defibrillator?no 8. Weight loss meds? Yes Zepbound  Pt has been advised to stop 7 days prior to procedures    MEDICATIONS & ALLERGIES:    Patient reports the following regarding taking any anticoagulation/antiplatelet therapy:   Plavix, Coumadin, Eliquis, Xarelto, Lovenox, Pradaxa, Brilinta, or Effient? no Aspirin? no  Patient confirms/reports the following medications:  Current Outpatient Medications  Medication Sig Dispense Refill   busPIRone (BUSPAR) 5 MG tablet Take 5 mg by mouth 2 (two) times daily.     cholecalciferol (VITAMIN D3) 25 MCG (1000 UNIT) tablet Take 1,000 Units by mouth daily.     escitalopram (LEXAPRO) 20 MG tablet Take 20 mg by mouth daily.     estradiol (ESTRACE) 0.1 MG/GM vaginal cream Place 1 Applicatorful vaginally daily.     estradiol (VIVELLE-DOT) 0.1 MG/24HR patch Place 1 patch onto the skin 2 (two) times a week.     famotidine (PEPCID) 20 MG tablet Take  20 mg by mouth 2 (two) times daily.     progesterone (PROMETRIUM) 100 MG capsule Take 100 mg by mouth daily.     tirzepatide  (ZEPBOUND ) 2.5 MG/0.5ML injection vial Inject 2.5 mg into the skin once a week. 0.5 mL 11   tirzepatide  (ZEPBOUND ) 5 MG/0.5ML Pen Inject 5 mg into the skin once a week. 2 mL 5   TURMERIC PO Take by mouth.     No current facility-administered medications for this visit.    Patient confirms/reports the following allergies:  No Known Allergies  No orders of the defined types were placed in this encounter.   AUTHORIZATION INFORMATION Primary Insurance: 1D#: Group #:  Secondary Insurance: 1D#: Group #:  SCHEDULE INFORMATION: Date: 12/09/23 Time: Location: [facility]

## 2023-11-20 ENCOUNTER — Ambulatory Visit: Payer: Self-pay | Admitting: Family Medicine

## 2023-11-25 ENCOUNTER — Other Ambulatory Visit: Payer: Self-pay | Admitting: Family Medicine

## 2023-11-25 DIAGNOSIS — Z7689 Persons encountering health services in other specified circumstances: Secondary | ICD-10-CM

## 2023-11-27 NOTE — Telephone Encounter (Signed)
 Please review. Is pt on 2.5 or 5?  KP

## 2023-11-27 NOTE — Telephone Encounter (Signed)
 Requested medication (s) are due for refill today: yes  Requested medication (s) are on the active medication list: yes  Last refill:  05/23/23 2 ml 5 RF  Future visit scheduled: yes  Notes to clinic:  no protocol assigned to medication   Requested Prescriptions  Pending Prescriptions Disp Refills   ZEPBOUND  5 MG/0.5ML Pen [Pharmacy Med Name: Zepbound  5 MG/0.5ML Subcutaneous Solution Auto-injector] 4 mL 0    Sig: INJECT 1 SYRINGE SUBCUTANEOUSLY ONCE A WEEK     Off-Protocol Failed - 11/27/2023  7:34 AM      Failed - Medication not assigned to a protocol, review manually.      Passed - Valid encounter within last 12 months    Recent Outpatient Visits           4 weeks ago Obesity with serious comorbidity, unspecified class, unspecified obesity type   [facility] at [facility], Selinda PARAS, MD       Future Appointments             In 6 months Alvia, Selinda PARAS, MD [facility] at [facility], [facility]

## 2023-12-02 ENCOUNTER — Other Ambulatory Visit: Payer: Self-pay | Admitting: Medical Genetics

## 2023-12-02 ENCOUNTER — Encounter: Payer: Self-pay | Admitting: Gastroenterology

## 2023-12-04 ENCOUNTER — Other Ambulatory Visit
Admission: RE | Admit: 2023-12-04 | Discharge: 2023-12-04 | Disposition: A | Discharge status: Home / Self Care | Payer: Self-pay | Source: Ambulatory Visit | Attending: Medical Genetics | Admitting: Medical Genetics

## 2023-12-09 ENCOUNTER — Ambulatory Visit
Admission: RE | Admit: 2023-12-09 | Discharge: 2023-12-09 | Disposition: A | Discharge status: Home / Self Care | Attending: Gastroenterology | Admitting: Gastroenterology

## 2023-12-09 ENCOUNTER — Other Ambulatory Visit: Payer: Self-pay

## 2023-12-09 ENCOUNTER — Encounter: Payer: Self-pay | Admitting: Gastroenterology

## 2023-12-09 ENCOUNTER — Ambulatory Visit: Admitting: Anesthesiology

## 2023-12-09 ENCOUNTER — Encounter
Admission: RE | Disposition: A | Discharge status: Home / Self Care | Payer: Self-pay | Source: Home / Self Care | Attending: Gastroenterology

## 2023-12-09 DIAGNOSIS — K2281 Esophageal polyp: Secondary | ICD-10-CM | POA: Diagnosis not present

## 2023-12-09 DIAGNOSIS — K5730 Diverticulosis of large intestine without perforation or abscess without bleeding: Secondary | ICD-10-CM | POA: Insufficient documentation

## 2023-12-09 DIAGNOSIS — Z79899 Other long term (current) drug therapy: Secondary | ICD-10-CM | POA: Insufficient documentation

## 2023-12-09 DIAGNOSIS — F32A Depression, unspecified: Secondary | ICD-10-CM | POA: Insufficient documentation

## 2023-12-09 DIAGNOSIS — K219 Gastro-esophageal reflux disease without esophagitis: Secondary | ICD-10-CM | POA: Diagnosis not present

## 2023-12-09 DIAGNOSIS — K5790 Diverticulosis of intestine, part unspecified, without perforation or abscess without bleeding: Secondary | ICD-10-CM | POA: Diagnosis not present

## 2023-12-09 DIAGNOSIS — K449 Diaphragmatic hernia without obstruction or gangrene: Secondary | ICD-10-CM | POA: Insufficient documentation

## 2023-12-09 DIAGNOSIS — F419 Anxiety disorder, unspecified: Secondary | ICD-10-CM | POA: Insufficient documentation

## 2023-12-09 DIAGNOSIS — Z09 Encounter for follow-up examination after completed treatment for conditions other than malignant neoplasm: Secondary | ICD-10-CM | POA: Diagnosis not present

## 2023-12-09 DIAGNOSIS — K640 First degree hemorrhoids: Secondary | ICD-10-CM | POA: Diagnosis not present

## 2023-12-09 DIAGNOSIS — K2950 Unspecified chronic gastritis without bleeding: Secondary | ICD-10-CM | POA: Diagnosis not present

## 2023-12-09 DIAGNOSIS — Z87891 Personal history of nicotine dependence: Secondary | ICD-10-CM | POA: Diagnosis not present

## 2023-12-09 DIAGNOSIS — K22719 Barrett's esophagus with dysplasia, unspecified: Secondary | ICD-10-CM

## 2023-12-09 DIAGNOSIS — Z800 Family history of malignant neoplasm of digestive organs: Secondary | ICD-10-CM | POA: Diagnosis not present

## 2023-12-09 DIAGNOSIS — Z1211 Encounter for screening for malignant neoplasm of colon: Secondary | ICD-10-CM | POA: Insufficient documentation

## 2023-12-09 DIAGNOSIS — K317 Polyp of stomach and duodenum: Secondary | ICD-10-CM | POA: Diagnosis not present

## 2023-12-09 HISTORY — PX: ESOPHAGOGASTRODUODENOSCOPY: SHX5428

## 2023-12-09 HISTORY — PX: COLONOSCOPY: SHX5424

## 2023-12-09 LAB — POCT PREGNANCY, URINE: Preg Test, Ur: NEGATIVE

## 2023-12-09 SURGERY — COLONOSCOPY
Anesthesia: General

## 2023-12-09 MED ORDER — SODIUM CHLORIDE 0.9 % IV SOLN: INTRAVENOUS | Status: DC

## 2023-12-09 MED ORDER — LIDOCAINE HCL (CARDIAC) PF 100 MG/5ML IV SOSY
PREFILLED_SYRINGE | INTRAVENOUS | Status: DC | PRN
  Administered 2023-12-09 (×2): 100 mg via INTRAVENOUS

## 2023-12-09 MED ORDER — PROPOFOL 500 MG/50ML IV EMUL
INTRAVENOUS | Status: DC | PRN
  Administered 2023-12-09: 20 mg via INTRAVENOUS
  Administered 2023-12-09: 150 ug/kg/min via INTRAVENOUS
  Administered 2023-12-09: 50 mg via INTRAVENOUS

## 2023-12-09 NOTE — Transfer of Care (Signed)
 Immediate Anesthesia Transfer of Care Note  Patient: Alexandra Salinas  Procedure(s) Performed: COLONOSCOPY EGD (ESOPHAGOGASTRODUODENOSCOPY)  Patient Location: PACU  Anesthesia Type:General  Level of Consciousness: patient cooperative  Airway & Oxygen Therapy: Patient Spontanous Breathing and Patient connected to nasal cannula oxygen  Post-op Assessment: Report given to RN and Post -op Vital signs reviewed and stable  Post vital signs: stable  Last Vitals:  Vitals Value Taken Time  BP    Temp 35.7 C 12/09/23 10:13  Pulse    Resp    SpO2      Last Pain:  Vitals:   12/09/23 1013  TempSrc: Temporal  PainSc:          Complications: No notable events documented.

## 2023-12-09 NOTE — Anesthesia Postprocedure Evaluation (Signed)
 Anesthesia Post Note  Patient: Alexandra Salinas  Procedure(s) Performed: COLONOSCOPY EGD (ESOPHAGOGASTRODUODENOSCOPY)  Patient location during evaluation: PACU Anesthesia Type: General Level of consciousness: awake and alert, oriented and patient cooperative Pain management: pain level controlled Vital Signs Assessment: post-procedure vital signs reviewed and stable Respiratory status: spontaneous breathing, nonlabored ventilation and respiratory function stable Cardiovascular status: blood pressure returned to baseline and stable Postop Assessment: adequate PO intake Anesthetic complications: no   There were no known notable events for this encounter.   Last Vitals:  Vitals:   12/09/23 1013 12/09/23 1023  BP: (!) 87/51 91/62  Pulse: (!) 55 (!) 56  Resp: 12 14  Temp: (!) 35.7 C   SpO2: 100% 100%    Last Pain:  Vitals:   12/09/23 1023  TempSrc:   PainSc: 0-No pain                 Alfonso Ruths

## 2023-12-09 NOTE — Op Note (Signed)
 [facility] Gastroenterology Patient Name: Alexandra Salinas Procedure Date: 12/09/2023 9:38 AM MRN: [id number] Account #: [id number] Date of Birth: 03-19-1976 Admit Type: Outpatient Age: 48 Room: [facility] ENDO ROOM 4 Gender: Female Note Status: Finalized Instrument Name: Upper Endoscope [id number] Procedure:             Upper GI endoscopy Indications:           Follow-up of Barrett's esophagus Providers:             Rogelia Copping MD, MD Referring MD:          Selinda DOROTHA Ku (Referring MD) Medicines:             Propofol  per Anesthesia Complications:         No immediate complications. Procedure:             Pre-Anesthesia Assessment:                        - Prior to the procedure, a History and Physical was                         performed, and patient medications and allergies were                         reviewed. The patient's tolerance of previous                         anesthesia was also reviewed. The risks and benefits                         of the procedure and the sedation options and risks                         were discussed with the patient. All questions were                         answered, and informed consent was obtained. Prior                         Anticoagulants: The patient has taken no anticoagulant                         or antiplatelet agents. ASA Grade Assessment: II - A                         patient with mild systemic disease. After reviewing                         the risks and benefits, the patient was deemed in                         satisfactory condition to undergo the procedure.                        After obtaining informed consent, the endoscope was                         passed under direct vision. Throughout the procedure,  the patient's blood pressure, pulse, and oxygen                         saturations were monitored continuously. The Endoscope                         was introduced through  the mouth, and advanced to the                         second part of duodenum. The upper GI endoscopy was                         accomplished without difficulty. The patient tolerated                         the procedure well. Findings:      A small hiatal hernia was present.      One polyp with no bleeding was found in the upper third of the       esophagus. The polyp was removed with a cold biopsy forceps. Resection       and retrieval were complete.      The stomach was normal.      The examined duodenum was normal. Impression:            - Small hiatal hernia.                        - Esophageal polyp(s) were found. Resected and                         retrieved.                        - Normal stomach.                        - Normal examined duodenum. Recommendation:        - Discharge patient to home.                        - Resume previous diet.                        - Continue present medications.                        - Await pathology results.                        - Perform a colonoscopy today. Procedure Code(s):     --- Professional ---                        [phone], Esophagogastroduodenoscopy, flexible,                         transoral; with biopsy, single or multiple Diagnosis Code(s):     --- Professional ---                        K22.70, Barrett's esophagus without dysplasia  K22.81, Esophageal polyp CPT copyright 2022 American Medical Association. All rights reserved. The codes documented in this report are preliminary and upon coder review may  be revised to meet current compliance requirements. Rogelia Copping MD, MD 12/09/2023 9:54:36 AM This report has been signed electronically. Number of Addenda: 0 Note Initiated On: 12/09/2023 9:38 AM Estimated Blood Loss:  Estimated blood loss: none.      [facility]

## 2023-12-09 NOTE — Op Note (Addendum)
 [facility] Gastroenterology Patient Name: Alexandra Salinas Procedure Date: 12/09/2023 9:37 AM MRN: [id number] Account #: [id number] Date of Birth: 1975/09/22 Admit Type: Outpatient Age: 48 Room: [facility] ENDO ROOM 4 Gender: Female Note Status: Finalized Instrument Name: Arvis [id number] Procedure:             Colonoscopy Indications:           Screening in patient at increased risk: Family history                         of 1st-degree relative with colorectal cancer Providers:             Rogelia Copping MD, MD Referring MD:          Selinda DOROTHA Ku (Referring MD) Medicines:             Propofol  per Anesthesia Complications:         No immediate complications. Procedure:             Pre-Anesthesia Assessment:                        - Prior to the procedure, a History and Physical was                         performed, and patient medications and allergies were                         reviewed. The patient's tolerance of previous                         anesthesia was also reviewed. The risks and benefits                         of the procedure and the sedation options and risks                         were discussed with the patient. All questions were                         answered, and informed consent was obtained. Prior                         Anticoagulants: The patient has taken no anticoagulant                         or antiplatelet agents. ASA Grade Assessment: II - A                         patient with mild systemic disease. After reviewing                         the risks and benefits, the patient was deemed in                         satisfactory condition to undergo the procedure.                        After obtaining informed consent, the colonoscope was  passed under direct vision. Throughout the procedure,                         the patient's blood pressure, pulse, and oxygen                         saturations were  monitored continuously. The                         Colonoscope was introduced through the anus and                         advanced to the the cecum, identified by appendiceal                         orifice and ileocecal valve. The colonoscopy was                         performed without difficulty. The patient tolerated                         the procedure well. The quality of the bowel                         preparation was excellent. Findings:      The perianal and digital rectal examinations were normal.      A few small-mouthed diverticula were found in the entire colon.      Non-bleeding internal hemorrhoids were found during retroflexion. The       hemorrhoids were Grade I (internal hemorrhoids that do not prolapse). Impression:            - Diverticulosis in the entire examined colon.                        - Non-bleeding internal hemorrhoids.                        - No specimens collected. Recommendation:        - Discharge patient to home.                        - Resume previous diet.                        - Continue present medications.                        - Repeat colonoscopy in 5 years for surveillance. Procedure Code(s):     --- Professional ---                        [phone], Colonoscopy, flexible; diagnostic, including                         collection of specimen(s) by brushing or washing, when                         performed (separate procedure) Diagnosis Code(s):     --- Professional ---  Z80.0, Family history of malignant neoplasm of                         digestive organs CPT copyright 2022 American Medical Association. All rights reserved. The codes documented in this report are preliminary and upon coder review may  be revised to meet current compliance requirements. Rogelia Copping MD, MD 12/09/2023 10:11:34 AM This report has been signed electronically. Number of Addenda: 0 Note Initiated On: 12/09/2023 9:37 AM Scope Withdrawal  Time: 0 hours 5 minutes 38 seconds  Total Procedure Duration: 0 hours 14 minutes 8 seconds  Estimated Blood Loss:  Estimated blood loss: none.      [facility]

## 2023-12-09 NOTE — Anesthesia Preprocedure Evaluation (Addendum)
 Anesthesia Evaluation  Patient identified by MRN, date of birth, ID band Patient awake    Reviewed: Allergy & Precautions, NPO status , Patient's Chart, lab work & pertinent test results  History of Anesthesia Complications Negative for: history of anesthetic complications  Airway Mallampati: I   Neck ROM: Full    Dental no notable dental hx.    Pulmonary former smoker (quit 2017)   Pulmonary exam normal breath sounds clear to auscultation       Cardiovascular Exercise Tolerance: Good Normal cardiovascular exam Rhythm:Regular Rate:Normal  ECG 04/02/23: normal   Neuro/Psych  PSYCHIATRIC DISORDERS Anxiety Depression    negative neurological ROS     GI/Hepatic ,GERD  ,,  Endo/Other  negative endocrine ROS    Renal/GU negative Renal ROS     Musculoskeletal  (+) Arthritis ,    Abdominal   Peds  Hematology negative hematology ROS (+)   Anesthesia Other Findings Last dose of Zepbound  11/22/23.    Cardiology note 04/02/23:  Alexandra Salinas is a 48 y.o. female with hyperlipidemia, family history of CAD   Chest pain: Most likely nonanginal.  Recommend exercise treadmill stress test.  In addition, we will also obtain CT cardiac scoring scan, primarily for risk stratification given her hyperlipidemia and family history of CAD.   I did not appreciate murmur on exam.   Mixed hyperlipidemia: Reviewed lipid panel results with the patient.  Will obtain calcium score scan for risk stratification.  Discussed heart healthy diet and lifestyle.   Reproductive/Obstetrics                              Anesthesia Physical Anesthesia Plan  ASA: 2  Anesthesia Plan: General   Post-op Pain Management:    Induction: Intravenous  PONV Risk Score and Plan: 3 and Propofol  infusion, TIVA and Treatment may vary due to age or medical condition  Airway Management Planned: Natural Airway  Additional  Equipment:   Intra-op Plan:   Post-operative Plan:   Informed Consent: I have reviewed the patients History and Physical, chart, labs and discussed the procedure including the risks, benefits and alternatives for the proposed anesthesia with the patient or authorized representative who has indicated his/her understanding and acceptance.       Plan Discussed with: CRNA  Anesthesia Plan Comments: (LMA/GETA backup discussed.  Patient consented for risks of anesthesia including but not limited to:  - adverse reactions to medications - damage to eyes, teeth, lips or other oral mucosa - nerve damage due to positioning  - sore throat or hoarseness - damage to heart, brain, nerves, lungs, other parts of body or loss of life  Informed patient about role of CRNA in peri- and intra-operative care.  Patient voiced understanding.)         Anesthesia Quick Evaluation

## 2023-12-09 NOTE — H&P (Signed)
 Rogelia Copping, MD [facility] [address], [address] [place], [place] [address] Phone:[phone] Fax : [phone]  Primary Care Physician:  Alvia Selinda PARAS, MD Primary Gastroenterologist:  Dr. Copping  Pre-Procedure History & Physical: HPI:  Alexandra Salinas is a 48 y.o. female is here for an endoscopy and colonoscopy.   Past Medical History:  Diagnosis Date   Anxiety    Basal cell carcinoma 2023   right forehead, Mohs   Depression    Diverticulitis    GERD (gastroesophageal reflux disease)    H/O basal cell carcinoma excision 05/23/2023    Past Surgical History:  Procedure Laterality Date   BASAL CELL CARCINOMA EXCISION  10/2021   forehead   CESAREAN SECTION  2021   COLONOSCOPY  2020   ESOPHAGOGASTRODUODENOSCOPY  11/2020   REFRACTIVE SURGERY Bilateral 2015    Prior to Admission medications   Medication Sig Start Date End Date Taking? Authorizing Provider  busPIRone (BUSPAR) 5 MG tablet Take 5 mg by mouth 2 (two) times daily.   Yes [provider]  cholecalciferol (VITAMIN D3) 25 MCG (1000 UNIT) tablet Take 1,000 Units by mouth daily.   Yes [provider]  escitalopram (LEXAPRO) 20 MG tablet Take 20 mg by mouth daily.   Yes [provider]  estradiol (VIVELLE-DOT) 0.1 MG/24HR patch Place 1 patch onto the skin 2 (two) times a week.   Yes [provider]  famotidine (PEPCID) 20 MG tablet Take 20 mg by mouth 2 (two) times daily.   Yes [provider]  progesterone (PROMETRIUM) 100 MG capsule Take 100 mg by mouth daily.   Yes [provider]  TURMERIC PO Take by mouth.   Yes [provider]  estradiol (ESTRACE) 0.1 MG/GM vaginal cream Place 1 Applicatorful vaginally daily.    [provider]  tirzepatide  (ZEPBOUND ) 5 MG/0.5ML Pen INJECT 1 SYRINGE SUBCUTANEOUSLY ONCE A WEEK 11/27/23   Alvia Selinda PARAS, MD    Allergies as of 11/01/2023   (No Known Allergies)    Family History  Problem Relation Age  of Onset   Anxiety disorder Mother    Depression Mother    Kidney disease Mother    Hypertension Mother    Diabetes Mother    Vascular Disease Mother    Dementia Mother    Stroke Mother    Heart disease Father    Colon cancer Father    Drug abuse Brother    Hyperlipidemia Brother    Hypertension Brother    Depression Brother    Alcoholism Brother    Depression Maternal Aunt    Anxiety disorder Maternal Aunt    Anxiety disorder Maternal Uncle    Depression Maternal Uncle    Stroke Maternal Grandfather    Diabetes Maternal Grandmother    Heart disease Maternal Grandmother    Lung cancer Paternal Grandmother     Social History   Socioeconomic History   Marital status: Married    Spouse name: Aza Dantes   Number of children: 1   Years of education: 16   Highest education level: Bachelor's degree (e.g., BA, AB, BS)  Occupational History   Not on file  Tobacco Use   Smoking status: Former    Current packs/day: 0.00    Average packs/day: 1 pack/day for 20.0 years (20.0 ttl pk-yrs)    Types: Cigarettes    Start date: 46    Quit date: 2017    Years since quitting: 8.5   Smokeless tobacco: Never  Vaping Use  Vaping status: Never Used  Substance and Sexual Activity   Alcohol use: Yes    Alcohol/week: 7.0 standard drinks of alcohol    Types: 7 Glasses of wine per week    Comment: occ   Drug use: Never   Sexual activity: Yes    Partners: Male  Other Topics Concern   Not on file  Social History Narrative   Not on file   Social Drivers of Health   Financial Resource Strain: Low Risk  (10/29/2023)   Overall Financial Resource Strain (CARDIA)    Difficulty of Paying Living Expenses: Not very hard  Food Insecurity: No Food Insecurity (10/29/2023)   Hunger Vital Sign    Worried About Running Out of Food in the Last Year: Never true    Ran Out of Food in the Last Year: Never true  Transportation Needs: No Transportation Needs (10/29/2023)   PRAPARE -  Administrator, Civil Service (Medical): No    Lack of Transportation (Non-Medical): No  Physical Activity: Sufficiently Active (10/29/2023)   Exercise Vital Sign    Days of Exercise per Week: 4 days    Minutes of Exercise per Session: 60 min  Stress: Stress Concern Present (10/29/2023)   [employer] of Occupational Health - Occupational Stress Questionnaire    Feeling of Stress : To some extent  Social Connections: Moderately Integrated (10/29/2023)   Social Connection and Isolation Panel    Frequency of Communication with Friends and Family: Twice a week    Frequency of Social Gatherings with Friends and Family: Once a week    Attends Religious Services: Never    Database administrator or Organizations: Yes    Attends Engineer, structural: More than 4 times per year    Marital Status: Married  Catering manager Violence: Not At Risk (05/11/2022)   Humiliation, Afraid, Rape, and Kick questionnaire    Fear of Current or Ex-Partner: No    Emotionally Abused: No    Physically Abused: No    Sexually Abused: No    Review of Systems: See HPI, otherwise negative ROS  Physical Exam: BP (!) 102/41   Pulse 65   Temp (!) 96.7 F (35.9 C) (Temporal)   Resp 16   Ht 5' 4 (1.626 m)   Wt 63 kg   SpO2 100%   BMI 23.86 kg/m  General:   Alert,  pleasant and cooperative in NAD Head:  Normocephalic and atraumatic. Neck:  Supple; no masses or thyromegaly. Lungs:  Clear throughout to auscultation.    Heart:  Regular rate and rhythm. Abdomen:  Soft, nontender and nondistended. Normal bowel sounds, without guarding, and without rebound.   Neurologic:  Alert and  oriented x4;  grossly normal neurologically.  Impression/Plan: Alexandra Salinas is here for an endoscopy and colonoscopy to be performed for family history of colon cancer and barrett's esophagus  Risks, benefits, limitations, and alternatives regarding  endoscopy and colonoscopy have been reviewed with  the patient.  Questions have been answered.  All parties agreeable.   Rogelia Copping, MD  12/09/2023, 9:44 AM

## 2023-12-10 ENCOUNTER — Encounter: Payer: Self-pay | Admitting: Gastroenterology

## 2023-12-10 LAB — SURGICAL PATHOLOGY

## 2023-12-12 ENCOUNTER — Ambulatory Visit: Payer: Self-pay | Admitting: Gastroenterology

## 2023-12-16 LAB — GENECONNECT MOLECULAR SCREEN: Genetic Analysis Overall Interpretation: NEGATIVE

## 2023-12-19 ENCOUNTER — Telehealth: Payer: Self-pay | Admitting: Psychiatry

## 2023-12-19 DIAGNOSIS — F3342 Major depressive disorder, recurrent, in full remission: Secondary | ICD-10-CM

## 2023-12-19 MED ORDER — ESCITALOPRAM OXALATE 20 MG PO TABS: 20.0000 mg | ORAL_TABLET | Freq: Every day | ORAL | 0 refills | Status: DC

## 2023-12-19 NOTE — Telephone Encounter (Signed)
 I have contacted patient to clarify her dosage.  I have sent the Lexapro  to pharmacy as requested.

## 2024-01-24 ENCOUNTER — Encounter: Payer: Self-pay | Admitting: Family Medicine

## 2024-01-24 ENCOUNTER — Ambulatory Visit: Admitting: Family Medicine

## 2024-01-24 VITALS — BP 110/70 | HR 81 | Ht 64.0 in | Wt 139.4 lb

## 2024-01-24 DIAGNOSIS — S0006XA Insect bite (nonvenomous) of scalp, initial encounter: Secondary | ICD-10-CM

## 2024-01-24 DIAGNOSIS — M160 Bilateral primary osteoarthritis of hip: Secondary | ICD-10-CM | POA: Diagnosis not present

## 2024-01-24 DIAGNOSIS — W57XXXA Bitten or stung by nonvenomous insect and other nonvenomous arthropods, initial encounter: Secondary | ICD-10-CM | POA: Insufficient documentation

## 2024-01-24 DIAGNOSIS — G43109 Migraine with aura, not intractable, without status migrainosus: Secondary | ICD-10-CM

## 2024-01-24 DIAGNOSIS — M62838 Other muscle spasm: Secondary | ICD-10-CM | POA: Diagnosis not present

## 2024-01-24 MED ORDER — CYCLOBENZAPRINE HCL 5 MG PO TABS: 5.0000 mg | ORAL_TABLET | Freq: Every evening | ORAL | 0 refills | Status: DC | PRN

## 2024-01-24 NOTE — Assessment & Plan Note (Signed)
 Recurrent retinal migraines - Episodes characterized by transient blurring of vision in the left eye without associated headache. - Typical frequency is every six to eight weeks. - Two episodes occurred over the past weekend, temporally associated with increased stress from school and work.  Ocular migraine Intermittent ocular migraines with transient visual disturbances, increased frequency possibly due to stress. - Provided Nurtec sample for abortive treatment. - Advised to take Nurtec at onset, repeat in 24 hours if needed. - Instructed to report improvement for potential prescription.

## 2024-01-24 NOTE — Progress Notes (Signed)
 Primary Care / Sports Medicine Office Visit  Patient Information:  Patient ID: Alexandra Salinas, female DOB: 10-17-1975 Age: 48 y.o. MRN: [id number]   Alexandra Salinas is a pleasant 48 y.o. female presenting with the following:  Chief Complaint  Patient presents with   Cyst    Patient presents today for knots on the back of her head. She noticed them on 01/18/24. The knots are hard and tender. She did find a tick on her scalp the same day she noticed the knots.     Vitals:   01/24/24 0837  BP: 110/70  Pulse: 81  SpO2: 99%   Vitals:   01/24/24 0837  Weight: 139 lb 6.4 oz (63.2 kg)  Height: 5' 4 (1.626 m)   Body mass index is 23.93 kg/m.  No results found.   Independent interpretation of notes and tests performed by another provider:   None  Procedures performed:   None  Pertinent History, Exam, Impression, and Recommendations:   Problem List Items Addressed This Visit     Cervical paraspinal muscle spasm   See additional assessment(s) for plan details.  Muscle pain and tightness of neck and posterior scalp Muscle tightness and discomfort likely related to exercise and stress. - Prescribed cyclobenzaprine  muscle relaxer (5-10 mg) as needed at night.  Side effect and be drowsiness. - Advised to track symptoms and report significant changes.      Relevant Medications   cyclobenzaprine  (FLEXERIL ) 5 MG tablet   Ocular migraine   Recurrent retinal migraines - Episodes characterized by transient blurring of vision in the left eye without associated headache. - Typical frequency is every six to eight weeks. - Two episodes occurred over the past weekend, temporally associated with increased stress from school and work.  Ocular migraine Intermittent ocular migraines with transient visual disturbances, increased frequency possibly due to stress. - Provided Nurtec sample for abortive treatment. - Advised to take Nurtec at onset, repeat in 24 hours if  needed. - Instructed to report improvement for potential prescription.      Relevant Medications   cyclobenzaprine  (FLEXERIL ) 5 MG tablet   Primary osteoarthritis of hips, bilateral   Chronic hip pain - Persistent muscular pain in the hip, worsened despite adherence to prescribed exercises. - Pain is most pronounced when lying on the right side. - Desire to strengthen muscles to alleviate discomfort.  Chronic right hip pain Chronic pain with muscle tightness exacerbated by activity in the setting of right greater than left osteoarthritis. Previous exercises insufficient. - Referred to physical therapy for targeted exercises. - Advised follow-up if no improvement after six weeks for potential MRI evaluation.      Relevant Medications   cyclobenzaprine  (FLEXERIL ) 5 MG tablet   Other Relevant Orders   Ambulatory referral to Physical Therapy   Tick bite of scalp - Primary   Scalp nodules following tick bite - On August 30th, two hard, tender nodules developed on the posterior scalp, coinciding with a tick bite in the hair. - Initial presentation included redness and tenderness at the site, with subsequent reduction in erythema by the following day. - Nodules have decreased in size and are less painful over time. - No associated systemic symptoms such as fever or chills.  Physical Exam HEENT: Skin at bite site on head appears normal. NECK: Mild muscle tightness throughout paraspinal musculature, right greater than left.  No significant lymphadenopathy.  Muscle pain and tightness of neck and posterior scalp Muscle tightness and discomfort likely related  to exercise and stress. - Prescribed cyclobenzaprine  muscle relaxer (5-10 mg) as needed at night.  Side effect and be drowsiness. - Advised to track symptoms and report significant changes.        Orders & Medications Medications:  Meds ordered this encounter  Medications   cyclobenzaprine  (FLEXERIL ) 5 MG tablet    Sig: Take  1-2 tablets (5-10 mg total) by mouth at bedtime as needed.    Dispense:  30 tablet    Refill:  0   Orders Placed This Encounter  Procedures   Ambulatory referral to Physical Therapy     No follow-ups on file.     Selinda JINNY Ku, MD, [facility]   Primary Care Sports Medicine Primary Care and Sports Medicine at [facility]

## 2024-01-24 NOTE — Assessment & Plan Note (Signed)
 Scalp nodules following tick bite - On August 30th, two hard, tender nodules developed on the posterior scalp, coinciding with a tick bite in the hair. - Initial presentation included redness and tenderness at the site, with subsequent reduction in erythema by the following day. - Nodules have decreased in size and are less painful over time. - No associated systemic symptoms such as fever or chills.  Physical Exam HEENT: Skin at bite site on head appears normal. NECK: Mild muscle tightness throughout paraspinal musculature, right greater than left.  No significant lymphadenopathy.  Muscle pain and tightness of neck and posterior scalp Muscle tightness and discomfort likely related to exercise and stress. - Prescribed cyclobenzaprine  muscle relaxer (5-10 mg) as needed at night.  Side effect and be drowsiness. - Advised to track symptoms and report significant changes.

## 2024-01-24 NOTE — Assessment & Plan Note (Signed)
 See additional assessment(s) for plan details.  Muscle pain and tightness of neck and posterior scalp Muscle tightness and discomfort likely related to exercise and stress. - Prescribed cyclobenzaprine  muscle relaxer (5-10 mg) as needed at night.  Side effect and be drowsiness. - Advised to track symptoms and report significant changes.

## 2024-01-24 NOTE — Assessment & Plan Note (Signed)
 Chronic hip pain - Persistent muscular pain in the hip, worsened despite adherence to prescribed exercises. - Pain is most pronounced when lying on the right side. - Desire to strengthen muscles to alleviate discomfort.  Chronic right hip pain Chronic pain with muscle tightness exacerbated by activity in the setting of right greater than left osteoarthritis. Previous exercises insufficient. - Referred to physical therapy for targeted exercises. - Advised follow-up if no improvement after six weeks for potential MRI evaluation.

## 2024-01-24 NOTE — Patient Instructions (Addendum)
 Neck and Scalp Muscle Pain  - Take cyclobenzaprine  (5-10 mg) at night as needed for muscle tightness. This medicine may cause drowsiness. - Track your symptoms and report any significant changes.  Ocular (Retinal) Migraines  - Take Nurtec at the start of visual symptoms. If needed, you may take a second dose 24 hours later. - Inform us  if Nurtec helps, so we can discuss a prescription if needed.  Hip Pain and Muscle Tightness  - Attend physical therapy as scheduled for targeted hip exercises. - If hip pain does not improve after six weeks, schedule a follow-up visit to discuss further evaluation, such as an MRI.  Tick Bite on Scalp  - Monitor the bite area for any changes. - Watch for signs of infection or illness, such as fever, chills, spreading redness, or increased pain at the site.  When to Seek Immediate Medical Attention  - Fever, chills, or feeling very unwell - Spreading redness, swelling, or pus at the tick bite site - Severe headache, neck stiffness, or vision changes that do not go away - Sudden weakness, numbness, or trouble speaking  Continue to track your symptoms and follow up as directed.

## 2024-02-24 ENCOUNTER — Other Ambulatory Visit: Payer: Self-pay

## 2024-02-24 ENCOUNTER — Ambulatory Visit: Admitting: Psychiatry

## 2024-02-24 ENCOUNTER — Encounter: Payer: Self-pay | Admitting: Psychiatry

## 2024-02-24 VITALS — BP 124/83 | HR 65 | Temp 97.8°F | Ht 64.0 in | Wt 143.6 lb

## 2024-02-24 DIAGNOSIS — F418 Other specified anxiety disorders: Secondary | ICD-10-CM

## 2024-02-24 DIAGNOSIS — F3342 Major depressive disorder, recurrent, in full remission: Secondary | ICD-10-CM | POA: Diagnosis not present

## 2024-02-24 MED ORDER — BUSPIRONE HCL 5 MG PO TABS: 5.0000 mg | ORAL_TABLET | Freq: Two times a day (BID) | ORAL | 3 refills | Status: AC

## 2024-02-24 NOTE — Patient Instructions (Signed)
 [email or web address]  www.[email or web address]  [employer] [email or web address] [address], [place], [place] [address]  [phone]  [facility] [place] [email or web address] [address] [place], [place] [address]  [phone]   [facility] - [phone]  [facility] - [phone]  [facility] counseling - [phone] counseling [phone]  [facility] - [phone]   [facility] [address] [place], [place] [address]  [phone]    Alexandra Salinas [address] [place], [place] [address]  [phone]    [facility] [address] [place], [place] [address]  [phone]    Insomnia Insomnia is a sleep disorder that makes it difficult to fall asleep or stay asleep. Insomnia can cause fatigue, low energy, difficulty concentrating, mood swings, and poor performance at work or school. There are three different ways to classify insomnia: Difficulty falling asleep. Difficulty staying asleep. Waking up too early in the morning. Any type of insomnia can be long-term (chronic) or short-term (acute). Both are common. Short-term insomnia usually lasts for 3 months or less. Chronic insomnia occurs at least three times a week for longer than 3 months. What are the causes? Insomnia may be caused by another condition, situation, or substance, such as: Having certain mental health conditions, such as anxiety and depression. Using caffeine, alcohol, tobacco, or drugs. Having gastrointestinal conditions, such as gastroesophageal reflux disease (GERD). Having certain medical conditions. These include: Asthma. Alzheimer's disease. Stroke. Chronic pain. An overactive thyroid  gland (hyperthyroidism). Other sleep disorders, such as restless legs syndrome and sleep apnea. Menopause. Sometimes, the  cause of insomnia may not be known. What increases the risk? Risk factors for insomnia include: Gender. Females are affected more often than males. Age. Insomnia is more common as people get older. Stress and certain medical and mental health conditions. Lack of exercise. Having an irregular work schedule. This may include working night shifts and traveling between different time zones. What are the signs or symptoms? If you have insomnia, the main symptom is having trouble falling asleep or having trouble staying asleep. This may lead to other symptoms, such as: Feeling tired or having low energy. Feeling nervous about going to sleep. Not feeling rested in the morning. Having trouble concentrating. Feeling irritable, anxious, or depressed. How is this diagnosed? This condition may be diagnosed based on: Your symptoms and medical history. Your health care provider may ask about: Your sleep habits. Any medical conditions you have. Your mental health. A physical exam. How is this treated? Treatment for insomnia depends on the cause. Treatment may focus on treating an underlying condition that is causing the insomnia. Treatment may also include: Medicines to help you sleep. Counseling or therapy. Lifestyle adjustments to help you sleep better. Follow these instructions at home: Eating and drinking  Limit or avoid alcohol, caffeinated beverages, and products that contain nicotine and tobacco, especially close to bedtime. These can disrupt your sleep. Do not eat a large meal or eat spicy foods right before bedtime. This can lead to digestive discomfort that can make it hard for you to sleep. Sleep habits  Keep a sleep diary to help you and your health care provider figure out what could be causing your insomnia. Write down: When you sleep. When you wake up during the night. How well you sleep and how rested you feel the next day. Any side effects of medicines  you are taking. What  you eat and drink. Make your bedroom a dark, comfortable place where it is easy to fall asleep. Put up shades or blackout curtains to block light from outside. Use a white noise machine to block noise. Keep the temperature cool. Limit screen use before bedtime. This includes: Not watching TV. Not using your smartphone, tablet, or computer. Stick to a routine that includes going to bed and waking up at the same times every day and night. This can help you fall asleep faster. Consider making a quiet activity, such as reading, part of your nighttime routine. Try to avoid taking naps during the day so that you sleep better at night. Get out of bed if you are still awake after 15 minutes of trying to sleep. Keep the lights down, but try reading or doing a quiet activity. When you feel sleepy, go back to bed. General instructions Take over-the-counter and prescription medicines only as told by your health care provider. Exercise regularly as told by your health care provider. However, avoid exercising in the hours right before bedtime. Use relaxation techniques to manage stress. Ask your health care provider to suggest some techniques that may work well for you. These may include: Breathing exercises. Routines to release muscle tension. Visualizing peaceful scenes. Make sure that you drive carefully. Do not drive if you feel very sleepy. Keep all follow-up visits. This is important. Contact a health care provider if: You are tired throughout the day. You have trouble in your daily routine due to sleepiness. You continue to have sleep problems, or your sleep problems get worse. Get help right away if: You have thoughts about hurting yourself or someone else. Get help right away if you feel like you may hurt yourself or others, or have thoughts about taking your own life. Go to your nearest emergency room or: Call 911. Call the National Suicide Prevention Lifeline at 918-812-7591 or 988. This  is open 24 hours a day. Text the Crisis Text Line at 806-220-7961. Summary Insomnia is a sleep disorder that makes it difficult to fall asleep or stay asleep. Insomnia can be long-term (chronic) or short-term (acute). Treatment for insomnia depends on the cause. Treatment may focus on treating an underlying condition that is causing the insomnia. Keep a sleep diary to help you and your health care provider figure out what could be causing your insomnia. This information is not intended to replace advice given to you by your health care provider. Make sure you discuss any questions you have with your health care provider. Document Revised: 04/17/2021 Document Reviewed: 04/17/2021 Elsevier Patient Education  2024 [employer].

## 2024-02-24 NOTE — Progress Notes (Signed)
 BH MD OP Progress Note  02/24/2024 10:57 AM Alexandra Salinas  MRN:  [id number]  Chief Complaint:  Chief Complaint  Patient presents with   Follow-up   Anxiety   Depression   Medication Refill   Discussed the use of AI scribe software for clinical note transcription with the patient, who gave verbal consent to proceed.  History of Present Illness Alexandra Salinas is a 48 year old Caucasian female, employed, married, lives in [place], has a history of anxiety, depression was evaluated in office today for a follow-up appointment.  She describes feeling not so great since her last visit, noting low energy, persistent feelings of being down, significant anxiety, and irritability. 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 National news, particularly issues related to immigration and school safety, upsets and unnerves her, and she finds her daughter's recent participation in a journalist, newspaper at school particularly anxiety-provoking. She expresses worry about her daughter's loss of innocence and her own sense of helplessness as a parent in the current environment.  She continues to have difficulty managing anxiety, and although she tries to limit news exposure, she finds this challenging due to work and social circumstances. Participation in a local community group provides her with a sense of connection and reassurance that others share similar concerns. She expresses interest in finding a therapist to help process these stressors and improve coping strategies.  She continues to have ongoing sleep difficulties, with trouble falling asleep despite feeling tired at night. She identifies doom scrolling on her phone late at night as a behavior that negatively impacts her sleep quality. She  typically wakes up at 6:30 am to help her daughter prepare for school.  She states that she currently takes Lexapro  20 mg daily and Buspar  (buspirone ) 5 mg twice daily. She reports having a good supply of Lexapro  but is out of buspirone  at this time.  She denies any suicidality, homicidality or perceptual disturbances.   Visit Diagnosis:    ICD-10-CM   1. Recurrent major depressive disorder, in full remission  F33.42 busPIRone  (BUSPAR ) 5 MG tablet    2. Other specified anxiety disorders  F41.8 busPIRone  (BUSPAR ) 5 MG tablet   Generalized anxiety not occurring more days than not      Past Psychiatric History: I have reviewed past psychiatric history from progress note on 10/07/2023.  Past Medical History:  Past Medical History:  Diagnosis Date   Anxiety    Basal cell carcinoma 2023   right forehead, Mohs   Depression    Diverticulitis    GERD (gastroesophageal reflux disease)    H/O basal cell carcinoma excision 05/23/2023    Past Surgical History:  Procedure Laterality Date   BASAL CELL CARCINOMA EXCISION  10/2021   forehead   CESAREAN SECTION  2021   COLONOSCOPY  2020   COLONOSCOPY N/A 12/09/2023   Procedure: COLONOSCOPY;  Surgeon: Jinny Carmine, MD;  Location: [facility] ENDOSCOPY;  Service: Endoscopy;  Laterality: N/A;   ESOPHAGOGASTRODUODENOSCOPY  11/2020   ESOPHAGOGASTRODUODENOSCOPY N/A 12/09/2023   Procedure: EGD (ESOPHAGOGASTRODUODENOSCOPY);  Surgeon: Jinny Carmine, MD;  Location: [facility] ENDOSCOPY;  Service: Endoscopy;  Laterality: N/A;   REFRACTIVE SURGERY Bilateral 2015    Family Psychiatric History: I have reviewed family psychiatric history from progress note on 10/07/2023.  Family History:  Family History  Problem Relation Age of Onset   Anxiety disorder  Mother    Depression Mother    Kidney disease Mother    Hypertension Mother    Diabetes Mother    Vascular Disease Mother    Dementia Mother    Stroke Mother    Heart disease Father    Colon cancer Father     Drug abuse Brother    Hyperlipidemia Brother    Hypertension Brother    Depression Brother    Alcoholism Brother    Depression Maternal Aunt    Anxiety disorder Maternal Aunt    Anxiety disorder Maternal Uncle    Depression Maternal Uncle    Stroke Maternal Grandfather    Diabetes Maternal Grandmother    Heart disease Maternal Grandmother    Lung cancer Paternal Grandmother     Social History: I have reviewed social history from progress note on 10/07/2023. Social History   Socioeconomic History   Marital status: Married    Spouse name: Naidelyn Parrella   Number of children: 1   Years of education: 16   Highest education level: Bachelor's degree (e.g., BA, AB, BS)  Occupational History   Not on file  Tobacco Use   Smoking status: Former    Current packs/day: 0.00    Average packs/day: 1 pack/day for 20.0 years (20.0 ttl pk-yrs)    Types: Cigarettes    Start date: 31    Quit date: 2017    Years since quitting: 8.7   Smokeless tobacco: Never  Vaping Use   Vaping status: Never Used  Substance and Sexual Activity   Alcohol use: Yes    Alcohol/week: 7.0 standard drinks of alcohol    Types: 7 Glasses of wine per week    Comment: occ   Drug use: Never   Sexual activity: Yes    Partners: Male  Other Topics Concern   Not on file  Social History Narrative   Not on file   Social Drivers of Health   Financial Resource Strain: Low Risk  (01/23/2024)   Overall Financial Resource Strain (CARDIA)    Difficulty of Paying Living Expenses: Not hard at all  Food Insecurity: No Food Insecurity (01/23/2024)   Hunger Vital Sign    Worried About Running Out of Food in the Last Year: Never true    Ran Out of Food in the Last Year: Never true  Transportation Needs: No Transportation Needs (01/23/2024)   PRAPARE - Administrator, Civil Service (Medical): No    Lack of Transportation (Non-Medical): No  Physical Activity: Sufficiently Active (01/23/2024)   Exercise Vital Sign     Days of Exercise per Week: 4 days    Minutes of Exercise per Session: 60 min  Stress: No Stress Concern Present (01/23/2024)   [employer] of Occupational Health - Occupational Stress Questionnaire    Feeling of Stress: Only a little  Recent Concern: Stress - Stress Concern Present (10/29/2023)   [employer] of Occupational Health - Occupational Stress Questionnaire    Feeling of Stress : To some extent  Social Connections: Moderately Integrated (01/23/2024)   Social Connection and Isolation Panel    Frequency of Communication with Friends and Family: More than three times a week    Frequency of Social Gatherings with Friends and Family: Twice a week    Attends Religious Services: Never    Database Administrator or Organizations: Yes    Attends Banker Meetings: 1 to 4 times per year    Marital Status: Married  Allergies: No Known Allergies  Metabolic Disorder Labs: Lab Results  Component Value Date   HGBA1C 5.2 05/23/2023   No results found for: PROLACTIN Lab Results  Component Value Date   CHOL 197 05/23/2023   TRIG 115 05/23/2023   HDL 51 05/23/2023   CHOLHDL 3.9 05/23/2023   LDLCALC 125 (H) 05/23/2023   LDLCALC 118 (H) 04/16/2023   Lab Results  Component Value Date   TSH 1.720 05/23/2023   TSH 2.070 05/09/2022    Therapeutic Level Labs: No results found for: LITHIUM No results found for: VALPROATE No results found for: CBMZ  Current Medications: Current Outpatient Medications  Medication Sig Dispense Refill   busPIRone  (BUSPAR ) 5 MG tablet Take 1 tablet (5 mg total) by mouth 2 (two) times daily. 180 tablet 3   cholecalciferol (VITAMIN D3) 25 MCG (1000 UNIT) tablet Take 1,000 Units by mouth daily.     cyclobenzaprine  (FLEXERIL ) 5 MG tablet Take 1-2 tablets (5-10 mg total) by mouth at bedtime as needed. 30 tablet 0   escitalopram  (LEXAPRO ) 20 MG tablet Take 1 tablet (20 mg total) by mouth daily with breakfast. 90 tablet 0    estradiol (ESTRACE) 0.1 MG/GM vaginal cream Place 1 Applicatorful vaginally daily.     estradiol (VIVELLE-DOT) 0.1 MG/24HR patch Place 1 patch onto the skin 2 (two) times a week.     famotidine (PEPCID) 20 MG tablet Take 20 mg by mouth 2 (two) times daily.     progesterone (PROMETRIUM) 100 MG capsule Take 100 mg by mouth daily.     tirzepatide  (ZEPBOUND ) 2.5 MG/0.5ML Pen Inject 2.5 mg into the skin once a week.     TURMERIC PO Take by mouth.     No current facility-administered medications for this visit.     Musculoskeletal: Strength & Muscle Tone: within normal limits Gait & Station: normal Patient leans: N/A  Psychiatric Specialty Exam: Review of Systems  Psychiatric/Behavioral:  Positive for sleep disturbance. The patient is nervous/anxious.     Blood pressure 124/83, pulse 65, temperature 97.8 F (36.6 C), temperature source Temporal, height 5' 4 (1.626 m), weight 143 lb 9.6 oz (65.1 kg).Body mass index is 24.65 kg/m.  General Appearance: Casual  Eye Contact:  Fair  Speech:  Clear and Coherent  Volume:  Normal  Mood:  Anxious  Affect:  Appropriate  Thought Process:  Goal Directed and Descriptions of Associations: Intact  Orientation:  Full (Time, Place, and Person)  Thought Content: Logical   Suicidal Thoughts:  No  Homicidal Thoughts:  No  Memory:  Immediate;   Fair Recent;   Fair Remote;   Fair  Judgement:  Fair  Insight:  Fair  Psychomotor Activity:  Normal  Concentration:  Concentration: Fair and Attention Span: Fair  Recall:  [employer] of Knowledge: Fair  Language: Fair  Akathisia:  No  Handed:  Right  AIMS (if indicated): not done  Assets:  Communication Skills Desire for Improvement Housing Social Support  ADL's:  Intact  Cognition: WNL  Sleep:  Poor   Screenings: AUDIT    Flowsheet Row Office Visit from 01/24/2024 in [facility] at [facility] Office Visit from 10/30/2023 in [facility]  Medicine at [facility] Office Visit from 03/29/2023 in [facility] Medicine at [facility] Office Visit from 10/18/2022 in [facility] at [facility]  Alcohol Use Disorder Identification Test Final Score (AUDIT) 3  3  3  3    GAD-7    Flowsheet Row Office Visit from 01/24/2024 in [facility] at [facility] Office Visit from 10/30/2023 in [facility] Care & Sports Medicine at [facility] Office Visit from 10/07/2023 in [facility] Office Visit from 06/06/2023 in [facility] at [facility] Visit from 05/23/2023 in [facility] Primary [facility] & Sports Medicine at [facility]  Total GAD-7 Score 5 4 4  0 2   PHQ2-9    Flowsheet Row Office Visit from 02/24/2024 in [facility] Office Visit from 01/24/2024 in [facility] Primary Care & Sports Medicine at [facility] Office Visit from 10/30/2023 in [facility] Medicine at [facility] Office Visit from 10/07/2023 in [facility] Office Visit from 06/06/2023 in [facility] at [facility] Total Score 2 2 2 2  0  PHQ-9 Total Score 5 7 6 6 2    Flowsheet Row Office Visit from 02/24/2024 in [facility] Admission (Discharged) from 12/09/2023 in [facility] Office Visit from 10/07/2023 in [facility]  C-SSRS RISK CATEGORY No Risk No Risk No Risk     Assessment and Plan: Cindel Daugherty is a 48 year old Caucasian female who has a history of depression, anxiety was evaluated in office today.  Discussed assessment and plan as noted below.  1. Recurrent major depressive disorder, in full remission Currently denies any significant depression  symptoms. Continue Lexapro  20 mg daily Encouraged to start working on sleep hygiene since she has difficulty falling asleep. Not interested in a sleep medication at this time.  2. Other specified anxiety disorders-unstable Anxiety, irritability mostly triggered by recent political events and worrying about that.  Interested in establishing care with a therapist.  Not interested in any further medication changes. Encouraged to establish care with a therapist, provided resources. Continue Lexapro  20 mg daily Continue BuSpar  5 mg twice daily  Follow-up Follow-up in clinic in 3 months or sooner if needed.  Collaboration of Care: Collaboration of Care: Referral or follow-up with counselor/therapist AEB and encouraged to establish care with a therapist.  Patient/Guardian was advised Release of Information must be obtained prior to any record release in order to collaborate their care with an outside provider. Patient/Guardian was advised if they have not already done so to contact the registration department to sign all necessary forms in order for us  to release information regarding their care.   Consent: Patient/Guardian gives verbal consent for treatment and assignment of benefits for services provided during this visit. Patient/Guardian expressed understanding and agreed to proceed.  This note was generated in part or whole with voice recognition software. Voice recognition is usually quite accurate but there are transcription errors that can and very often do occur. I apologize for any typographical errors that were not detected and corrected.     Lupita Rosales, MD 02/25/2024, 9:18 AM

## 2024-02-25 ENCOUNTER — Ambulatory Visit: Attending: Family Medicine | Admitting: Physical Therapy

## 2024-02-25 DIAGNOSIS — M25651 Stiffness of right hip, not elsewhere classified: Secondary | ICD-10-CM | POA: Diagnosis not present

## 2024-02-25 DIAGNOSIS — M25551 Pain in right hip: Secondary | ICD-10-CM | POA: Insufficient documentation

## 2024-02-25 DIAGNOSIS — M6281 Muscle weakness (generalized): Secondary | ICD-10-CM | POA: Diagnosis not present

## 2024-02-25 DIAGNOSIS — M25652 Stiffness of left hip, not elsewhere classified: Secondary | ICD-10-CM | POA: Insufficient documentation

## 2024-02-25 DIAGNOSIS — M160 Bilateral primary osteoarthritis of hip: Secondary | ICD-10-CM | POA: Insufficient documentation

## 2024-02-25 NOTE — Therapy (Unsigned)
 OUTPATIENT PHYSICAL THERAPY LOWER EXTREMITY EVALUATION  Patient Name: Alexandra Salinas MRN: [id number] DOB:Oct 18, 1975, 48 y.o., female Today's Date: 02/25/2024  END OF SESSION:  PT End of Session - 02/25/24 1604     Visit Number 1    Number of Visits 12    Date for Recertification  04/07/24    PT Start Time 1115    PT Stop Time 1200    PT Time Calculation (min) 45 min    Activity Tolerance Patient tolerated treatment well    Behavior During Therapy [facility] for tasks assessed/performed          Past Medical History:  Diagnosis Date   Anxiety    Basal cell carcinoma 2023   right forehead, Mohs   Depression    Diverticulitis    GERD (gastroesophageal reflux disease)    H/O basal cell carcinoma excision 05/23/2023   Past Surgical History:  Procedure Laterality Date   BASAL CELL CARCINOMA EXCISION  10/2021   forehead   CESAREAN SECTION  2021   COLONOSCOPY  2020   COLONOSCOPY N/A 12/09/2023   Procedure: COLONOSCOPY;  Surgeon: Jinny Carmine, MD;  Location: [facility] ENDOSCOPY;  Service: Endoscopy;  Laterality: N/A;   ESOPHAGOGASTRODUODENOSCOPY  11/2020   ESOPHAGOGASTRODUODENOSCOPY N/A 12/09/2023   Procedure: EGD (ESOPHAGOGASTRODUODENOSCOPY);  Surgeon: Jinny Carmine, MD;  Location: [facility] ENDOSCOPY;  Service: Endoscopy;  Laterality: N/A;   REFRACTIVE SURGERY Bilateral 2015   Patient Active Problem List   Diagnosis Date Noted   Ocular migraine 01/24/2024   Tick bite of scalp 01/24/2024   Family history of colon cancer in father 12/09/2023   Barrett's esophagus with dysplasia 12/09/2023   Polyp of esophagus 12/09/2023   Obesity with serious comorbidity 08/30/2023   Healthcare maintenance 05/23/2023   Depression with anxiety 05/23/2023   Mixed hyperlipidemia 04/02/2023   Encounter for weight management 10/18/2022   Carpal tunnel syndrome on left 06/12/2022   Low serum vitamin D  05/21/2022   Cervical paraspinal muscle spasm 06/07/2021   Annual physical exam 05/09/2021    Perimenopausal symptoms 05/02/2021   Primary osteoarthritis of hips, bilateral 03/20/2021    PCP: Alvia Selinda PARAS, MD  REFERRING PROVIDER: Alvia Selinda PARAS, MD  REFERRING DIAG: M16.0 (ICD-10-CM) - Primary osteoarthritis of hips, bilateral   THERAPY DIAG:  Bilateral hip joint arthritis  Pain in right hip  Muscle weakness (generalized)  Decreased range of motion of both hips  Rationale for Evaluation and Treatment: Rehabilitation  ONSET DATE: 10 year history of bilateral hip pain  SUBJECTIVE:   SUBJECTIVE STATEMENT: Pt presents to physical therapy with chronic, 10 year history of bilateral hip pain (right significantly > than left at time of eval). Pt reports that pain can increase to a 9/10 at its worst with inactivity, prolonged standing/walking/hiking/climbing stairs. Pt has found relief in symptoms with walking it off after a few minutes or with occasional use of heat. Pt has previously used Tylenol and muscle relaxers to address pain which was mostly unbeneficial for her. Pt denies any distal symptoms or sensations of numbness, tingling, or burning sensations.    PERTINENT HISTORY: Pt currently attends gym 5x a week where she does treadmill walking and general strengthening. Pt would like to increased her LE strength in order to decrease hip pain which she has not had much success with on her own up to this point.   PAIN:  Are you having pain? Yes: NPRS scale: 0/10 current, 9/10 at its worst Pain location: lateral aspect of right hip Pain description: deep,  achy Aggravating factors: Inactivity (especially sleeping), laying on right side, prolonged standing/walking/hiking/stair climbing Relieving factors: Heat, walking it off after 5 minutes  PRECAUTIONS: None  RED FLAGS: None   WEIGHT BEARING RESTRICTIONS: No  FALLS:  Has patient fallen in last 6 months? No  LIVING ENVIRONMENT: Lives with: lives with their family Lives in: House/apartment Stairs: Yes:  Internal: 8 steps; can reach both Has following equipment at home: None  OCCUPATION: Desk job, works from home   PLOF: Independent  PATIENT GOALS: To build up lower extremity strength to decrease hip pain and become independent with gym program to maintain gains made from therapy.   NEXT MD VISIT: PRN  OBJECTIVE:  Note: Objective measures were completed at Evaluation unless otherwise noted.  PATIENT SURVEYS:  LEFs: 56/80  COGNITION: Overall cognitive status: Within functional limits for tasks assessed   SENSATION: Light touch: WFL  MUSCLE LENGTH: Hamstrings: no significant restrictions noted bilaterally   POSTURE: rounded shoulders and forward head  PALPATION: No tenderness or pain noted with palpation to right hip region  LOWER EXTREMITY ROM:  Active ROM Right eval Left eval  Hip flexion 90 deg 95 deg  Hip extension    Hip abduction 70 deg 60 deg  Hip adduction    Hip internal rotation (seated) 18 deg 16 deg  Hip external rotation 40 deg 38 deg  Knee flexion    Knee extension    Ankle dorsiflexion    Ankle plantarflexion    Ankle inversion    Ankle eversion     (Blank rows = not tested)  LOWER EXTREMITY MMT:  MMT Right eval Left eval  Hip flexion 4 4  Hip extension 4- 4-  Hip abduction 4- 4-  Hip adduction 4+ 5  Hip internal rotation    Hip external rotation    Knee flexion 5 5  Knee extension 4+ 4+  Ankle dorsiflexion 4+ 4+  Ankle plantarflexion    Ankle inversion    Ankle eversion     (Blank rows = not tested)  JOINT MOBILITY Short and long axis distraction assessment Right hip: hypomobile, pain decreased  Left hip: hypomobile, pain decreased  LOWER EXTREMITY SPECIAL TESTS:  FADDIR: positive bilaterally SLR Test: negative for provocation of familiar symptoms  FUNCTIONAL TESTS:  5xSTS: 12.54 seconds  GAIT: Distance walked: 65 feet Assistive device utilized: None Level of assistance: Complete Independence Comments: Pt ambulates with  reciprocal gait pattern and decreased arm swing in RUE noted. Pt observed to ambulate with hips positioned slightly in externally rotated position with otherwise unremarkable compensations or significant abnormalities noted.   STAIRS: Pt ascended and descended steps with reciprocal gait pattern and without the use of HRs for UE support. Pt observed to negotiate stairs with hips positioned slightly in externally rotated position with otherwise unremarkable compensations or significant abnormalities noted.  TREATMENT DATE: 02/25/2024   See evaluation  Therapeutic Exercise:  Standing Hip Abduction, 2x15 Standing Hip Extension, 2x15 Seated Marches, 2x15 Walking Lunges in // bars, 3x down and back   PATIENT EDUCATION:  Education details: Pt educated on anatomy involved, diagnosis, prognosis, POC, HEP, and current gym program/other recommendations for activity to assist with current right hip pain Person educated: Patient Education method: Explanation, Demonstration, Tactile cues, Verbal cues, and Handouts Education comprehension: verbalized understanding and returned demonstration  HOME EXERCISE PROGRAM: Access Code: [email or web address] URL: [email or web address] Date: 02/25/2024 Prepared by: Ozell Sero Exercises - Standing Hip Abduction with Counter Support - 1 x daily - 3 x weekly - 2 sets - 15 reps - Standing Hip Extension with Counter Support - 1 x daily - 3 x weekly - 2 sets - 15 reps - Seated March - 1 x daily - 3 x weekly - 2 sets - 15 reps   ASSESSMENT:  CLINICAL IMPRESSION: Patient is a 48 y.o. female who was seen today for physical therapy evaluation and treatment for chronic bilateral hip pain (R>L). Pt with current deficits in bilateral hip hypomobility, bilateral hip joint mobility, right hip pain, and gross LE strength deficits bilaterally. These deficits  limit the patients ability to sleep comfortably at night, stand/climb stairs/walk for a prolonged period of time, or participate in gym activities without increase in right hip pain. Pt will benefit from skilled physical therapy intervention 1-2x a week for 6 weeks to address limitations listed above.   OBJECTIVE IMPAIRMENTS: Abnormal gait, decreased ROM, decreased strength, hypomobility, and pain.   ACTIVITY LIMITATIONS: carrying, bending, standing, squatting, sleeping, stairs, locomotion level, and caring for others  PARTICIPATION LIMITATIONS: meal prep, cleaning, laundry, shopping, and community activity  PERSONAL FACTORS: Age are also affecting patient's functional outcome.   REHAB POTENTIAL: Good  CLINICAL DECISION MAKING: Stable/uncomplicated  EVALUATION COMPLEXITY: Low   GOALS: Goals reviewed with patient? Yes  SHORT TERM GOALS: Target date: 03/17/2024 Pt to experience no greater than 5/10 pain at its worst so that she can sleep at night with greater ease.  Baseline:9/10 at worst Goal status: INITIAL  2.  Pt will perform 5xSTS in < 10 seconds to allow for improvement with functional transfers with greater ease.  Baseline: 12.54 seconds Goal status: INITIAL  LONG TERM GOALS: Target date: 04/07/2024  Pt to increase bilateral hip extension and abduction to at least a 4+/5 to improve ability with prolonged walking.  Baseline: see above Goal status: INITIAL  2.  Pt to increase LEFs score to at least 66 out of 80 to improve self perceived ability to complete daily household tasks without limitations.  Baseline: 56 out of 80 Goal status: INITIAL  3.  Pt to increase right hip flexion AROM to at least 95 degrees to become symmetrical bilaterally and allow for greater ease with climbing stairs.  Baseline: 90 degrees Goal status: INITIAL  PLAN:  PT FREQUENCY: 1-2x/week  PT DURATION: 6 weeks  PLANNED INTERVENTIONS: [id number]- PT Re-evaluation, 97110-Therapeutic exercises,  97530- Therapeutic activity, 97112- Neuromuscular re-education, 97535- Self Care, [id number]- Manual therapy, Patient/Family education, Joint mobilization, Cryotherapy, and Moist heat  PLAN FOR NEXT SESSION: Assess response to HEP, assess (posterior/anterior/lateral) joint mobility of bilateral hips, long axis distraction of right hip  Ozell JAYSON Sero, PT, DPT # [id number] Curtistine Bracket, Student-PT 02/25/2024, 4:06 PM

## 2024-02-26 ENCOUNTER — Encounter: Payer: Self-pay | Admitting: Physical Therapy

## 2024-02-27 ENCOUNTER — Ambulatory Visit: Admitting: Physical Therapy

## 2024-02-27 DIAGNOSIS — M6281 Muscle weakness (generalized): Secondary | ICD-10-CM

## 2024-02-27 DIAGNOSIS — M25651 Stiffness of right hip, not elsewhere classified: Secondary | ICD-10-CM

## 2024-02-27 DIAGNOSIS — M25551 Pain in right hip: Secondary | ICD-10-CM

## 2024-02-27 DIAGNOSIS — M160 Bilateral primary osteoarthritis of hip: Secondary | ICD-10-CM

## 2024-02-27 DIAGNOSIS — M25652 Stiffness of left hip, not elsewhere classified: Secondary | ICD-10-CM | POA: Diagnosis not present

## 2024-02-28 ENCOUNTER — Encounter: Payer: Self-pay | Admitting: Physical Therapy

## 2024-02-28 NOTE — Therapy (Signed)
 OUTPATIENT PHYSICAL THERAPY LOWER EXTREMITY TREATMENT  Patient Name: Alexandra Salinas MRN: [id number] DOB:1976/02/04, 48 y.o., female Today's Date: 02/28/2024  END OF SESSION:  PT End of Session - 02/28/24 0757     Visit Number 2    Number of Visits 12    Date for Recertification  04/07/24    PT Start Time 1111    PT Stop Time 1202    PT Time Calculation (min) 51 min    Activity Tolerance Patient tolerated treatment well    Behavior During Therapy [facility] for tasks assessed/performed          Past Medical History:  Diagnosis Date   Anxiety    Basal cell carcinoma 2023   right forehead, Mohs   Depression    Diverticulitis    GERD (gastroesophageal reflux disease)    H/O basal cell carcinoma excision 05/23/2023   Past Surgical History:  Procedure Laterality Date   BASAL CELL CARCINOMA EXCISION  10/2021   forehead   CESAREAN SECTION  2021   COLONOSCOPY  2020   COLONOSCOPY N/A 12/09/2023   Procedure: COLONOSCOPY;  Surgeon: Jinny Carmine, MD;  Location: [facility] ENDOSCOPY;  Service: Endoscopy;  Laterality: N/A;   ESOPHAGOGASTRODUODENOSCOPY  11/2020   ESOPHAGOGASTRODUODENOSCOPY N/A 12/09/2023   Procedure: EGD (ESOPHAGOGASTRODUODENOSCOPY);  Surgeon: Jinny Carmine, MD;  Location: [facility] ENDOSCOPY;  Service: Endoscopy;  Laterality: N/A;   REFRACTIVE SURGERY Bilateral 2015   Patient Active Problem List   Diagnosis Date Noted   Ocular migraine 01/24/2024   Tick bite of scalp 01/24/2024   Family history of colon cancer in father 12/09/2023   Barrett's esophagus with dysplasia 12/09/2023   Polyp of esophagus 12/09/2023   Obesity with serious comorbidity 08/30/2023   Healthcare maintenance 05/23/2023   Depression with anxiety 05/23/2023   Mixed hyperlipidemia 04/02/2023   Encounter for weight management 10/18/2022   Carpal tunnel syndrome on left 06/12/2022   Low serum vitamin D  05/21/2022   Cervical paraspinal muscle spasm 06/07/2021   Annual physical exam 05/09/2021    Perimenopausal symptoms 05/02/2021   Primary osteoarthritis of hips, bilateral 03/20/2021    PCP: Alvia Selinda PARAS, MD  REFERRING PROVIDER: Alvia Selinda PARAS, MD  REFERRING DIAG: M16.0 (ICD-10-CM) - Primary osteoarthritis of hips, bilateral   THERAPY DIAG:  Pain in right hip  Muscle weakness (generalized)  Decreased range of motion of both hips  Bilateral hip joint arthritis  Rationale for Evaluation and Treatment: Rehabilitation  ONSET DATE: 10 year history of bilateral hip pain  SUBJECTIVE:   SUBJECTIVE STATEMENT: Pt presents to physical therapy with chronic, 10 year history of bilateral hip pain (right significantly > than left at time of eval). Pt reports that pain can increase to a 9/10 at its worst with inactivity, prolonged standing/walking/hiking/climbing stairs. Pt has found relief in symptoms with walking it off after a few minutes or with occasional use of heat. Pt has previously used Tylenol and muscle relaxers to address pain which was mostly unbeneficial for her. Pt denies any distal symptoms or sensations of numbness, tingling, or burning sensations.    PERTINENT HISTORY: Pt currently attends gym 5x a week where she does treadmill walking and general strengthening. Pt would like to increased her LE strength in order to decrease hip pain which she has not had much success with on her own up to this point.   PAIN:  Are you having pain? Yes: NPRS scale: 0/10 current, 9/10 at its worst Pain location: lateral aspect of right hip Pain description: deep,  achy Aggravating factors: Inactivity (especially sleeping), laying on right side, prolonged standing/walking/hiking/stair climbing Relieving factors: Heat, walking it off after 5 minutes  PRECAUTIONS: None  RED FLAGS: None   WEIGHT BEARING RESTRICTIONS: No  FALLS:  Has patient fallen in last 6 months? No  LIVING ENVIRONMENT: Lives with: lives with their family Lives in: House/apartment Stairs: Yes:  Internal: 8 steps; can reach both Has following equipment at home: None  OCCUPATION: Desk job, works from home   PLOF: Independent  PATIENT GOALS: To build up lower extremity strength to decrease hip pain and become independent with gym program to maintain gains made from therapy.   NEXT MD VISIT: PRN  OBJECTIVE:  Note: Objective measures were completed at Evaluation unless otherwise noted.  PATIENT SURVEYS:  LEFs: 56/80  COGNITION: Overall cognitive status: Within functional limits for tasks assessed   SENSATION: Light touch: WFL  MUSCLE LENGTH: Hamstrings: no significant restrictions noted bilaterally   POSTURE: rounded shoulders and forward head  PALPATION: No tenderness or pain noted with palpation to right hip region  LOWER EXTREMITY ROM:  Active ROM Right eval Left eval  Hip flexion 90 deg 95 deg  Hip extension    Hip abduction 70 deg 60 deg  Hip adduction    Hip internal rotation (seated) 18 deg 16 deg  Hip external rotation 40 deg 38 deg  Knee flexion    Knee extension    Ankle dorsiflexion    Ankle plantarflexion    Ankle inversion    Ankle eversion     (Blank rows = not tested)  LOWER EXTREMITY MMT:  MMT Right eval Left eval  Hip flexion 4 4  Hip extension 4- 4-  Hip abduction 4- 4-  Hip adduction 4+ 5  Hip internal rotation    Hip external rotation    Knee flexion 5 5  Knee extension 4+ 4+  Ankle dorsiflexion 4+ 4+  Ankle plantarflexion    Ankle inversion    Ankle eversion     (Blank rows = not tested)  JOINT MOBILITY Short and long axis distraction assessment Right hip: hypomobile, pain decreased  Left hip: hypomobile, pain decreased  LOWER EXTREMITY SPECIAL TESTS:  FADDIR: positive bilaterally SLR Test: negative for provocation of familiar symptoms  FUNCTIONAL TESTS:  5xSTS: 12.54 seconds  GAIT: Distance walked: 65 feet Assistive device utilized: None Level of assistance: Complete Independence Comments: Pt ambulates with  reciprocal gait pattern and decreased arm swing in RUE noted. Pt observed to ambulate with hips positioned slightly in externally rotated position with otherwise unremarkable compensations or significant abnormalities noted.   STAIRS: Pt ascended and descended steps with reciprocal gait pattern and without the use of HRs for UE support. Pt observed to negotiate stairs with hips positioned slightly in externally rotated position with otherwise unremarkable compensations or significant abnormalities noted.  TREATMENT DATE: 02/28/2024   Subjective:  Pt. Entered PT with no new complaints.  Pt. Reports compliance with initial HEP.    Therapeutic Exercise:  Nustep L4 5 min. B UE/LE (warm-up)- discussed HEP/ hip stretches/ mobs. STS from chair with mirror feedback at //-bars.  Good technique/ hip hinge. Standing hip 3-way 20x each (no ankle wts.) Supine ball ex.: knee to chest (focus on hip flexion to 90 deg. With no compensatory movement patterns)/ trunk rotn./ bridging (discussed TrA muscle control).    Reviewed HEP (no changes at this time).    Manual tx.: Supine LE generalized stretches (focus on hip IR/ER)- limited R hip IR as compared to L.  Moderate B hip hypomobility noted.   Prone L and R hip IR/ER static stretches in pain tolerable range. Supine LAD to R LE to increase hip mobility/ decrease pain. Supine R hip grade III-IV mobs. (Inferior/ lateral)- MWM to promote increase L hip flexion/ IR.  3x30 sec. Each (no increase c/o pain).     PATIENT EDUCATION:  Education details: Pt educated on anatomy involved, diagnosis, prognosis, POC, HEP, and current gym program/other recommendations for activity to assist with current right hip pain Person educated: Patient Education method: Explanation, Demonstration, Tactile cues, Verbal cues, and Handouts Education comprehension:  verbalized understanding and returned demonstration  HOME EXERCISE PROGRAM: Access Code: [email or web address] URL: [email or web address] Date: 02/25/2024 Prepared by: Ozell Sero Exercises - Standing Hip Abduction with Counter Support - 1 x daily - 3 x weekly - 2 sets - 15 reps - Standing Hip Extension with Counter Support - 1 x daily - 3 x weekly - 2 sets - 15 reps - Seated March - 1 x daily - 3 x weekly - 2 sets - 15 reps   ASSESSMENT:  CLINICAL IMPRESSION: Patient presents with chronic bilateral hip pain (R>L). Pt with current deficits in bilateral hip hypomobility, bilateral hip joint mobility, right hip pain, and gross LE strength deficits bilaterally.  Good technique with squats/ STS in available hip ROM with no increase c/o pain.  Moderate R hip hypomobility noted and tolerates addition of mob belt with grade III-IV mobs to increase R hip flexion/ IR.  Pt will benefit from skilled physical therapy intervention 1-2x a week for 6 weeks to address limitations listed above.   OBJECTIVE IMPAIRMENTS: Abnormal gait, decreased ROM, decreased strength, hypomobility, and pain.   ACTIVITY LIMITATIONS: carrying, bending, standing, squatting, sleeping, stairs, locomotion level, and caring for others  PARTICIPATION LIMITATIONS: meal prep, cleaning, laundry, shopping, and community activity  PERSONAL FACTORS: Age are also affecting patient's functional outcome.   REHAB POTENTIAL: Good  CLINICAL DECISION MAKING: Stable/uncomplicated  EVALUATION COMPLEXITY: Low   GOALS: Goals reviewed with patient? Yes  SHORT TERM GOALS: Target date: 03/17/2024 Pt to experience no greater than 5/10 pain at its worst so that she can sleep at night with greater ease.  Baseline:9/10 at worst Goal status: INITIAL  2.  Pt will perform 5xSTS in < 10 seconds to allow for improvement with functional transfers with greater ease.  Baseline: 12.54 seconds Goal status: INITIAL  LONG TERM GOALS: Target date:  04/07/2024  Pt to increase bilateral hip extension and abduction to at least a 4+/5 to improve ability with prolonged walking.  Baseline: see above Goal status: INITIAL  2.  Pt to increase LEFs score to at least 66 out of 80 to improve self perceived ability to complete daily household tasks without limitations.  Baseline: 56 out of 80 Goal status: INITIAL  3.  Pt to increase right hip flexion AROM to at least 95 degrees to become symmetrical bilaterally and allow for greater ease with climbing stairs.  Baseline: 90 degrees Goal status: INITIAL  PLAN:  PT FREQUENCY: 1-2x/week  PT DURATION: 6 weeks  PLANNED INTERVENTIONS: 97164- PT Re-evaluation, 97110-Therapeutic exercises, 97530- Therapeutic activity, 97112- Neuromuscular re-education, 97535- Self Care, [id number]- Manual therapy, Patient/Family education, Joint mobilization, Cryotherapy, and Moist heat  PLAN FOR NEXT SESSION: Assess response to HEP, assess (posterior/anterior/lateral) joint mobility of bilateral hips, long axis distraction of right hip  Ozell JAYSON Sero, PT, DPT # [id number] 02/28/2024, 7:58 AM

## 2024-03-05 ENCOUNTER — Ambulatory Visit: Admitting: Physical Therapy

## 2024-03-05 DIAGNOSIS — M25651 Stiffness of right hip, not elsewhere classified: Secondary | ICD-10-CM | POA: Diagnosis not present

## 2024-03-05 DIAGNOSIS — M25551 Pain in right hip: Secondary | ICD-10-CM

## 2024-03-05 DIAGNOSIS — M160 Bilateral primary osteoarthritis of hip: Secondary | ICD-10-CM | POA: Diagnosis not present

## 2024-03-05 DIAGNOSIS — M6281 Muscle weakness (generalized): Secondary | ICD-10-CM

## 2024-03-05 DIAGNOSIS — M25652 Stiffness of left hip, not elsewhere classified: Secondary | ICD-10-CM | POA: Diagnosis not present

## 2024-03-05 NOTE — Therapy (Signed)
 OUTPATIENT PHYSICAL THERAPY LOWER EXTREMITY TREATMENT  Patient Name: Alexandra Salinas MRN: [id number] DOB:February 19, 1976, 48 y.o., female Today's Date: 03/05/2024  END OF SESSION:  PT End of Session - 03/05/24 1256     Visit Number 3    Number of Visits 12    Date for Recertification  04/07/24    PT Start Time 1115    PT Stop Time 1200    PT Time Calculation (min) 45 min    Activity Tolerance Patient tolerated treatment well;No increased pain    Behavior During Therapy [facility] for tasks assessed/performed           Past Medical History:  Diagnosis Date   Anxiety    Basal cell carcinoma 2023   right forehead, Mohs   Depression    Diverticulitis    GERD (gastroesophageal reflux disease)    H/O basal cell carcinoma excision 05/23/2023   Past Surgical History:  Procedure Laterality Date   BASAL CELL CARCINOMA EXCISION  10/2021   forehead   CESAREAN SECTION  2021   COLONOSCOPY  2020   COLONOSCOPY N/A 12/09/2023   Procedure: COLONOSCOPY;  Surgeon: Jinny Carmine, MD;  Location: [facility] ENDOSCOPY;  Service: Endoscopy;  Laterality: N/A;   ESOPHAGOGASTRODUODENOSCOPY  11/2020   ESOPHAGOGASTRODUODENOSCOPY N/A 12/09/2023   Procedure: EGD (ESOPHAGOGASTRODUODENOSCOPY);  Surgeon: Jinny Carmine, MD;  Location: [facility] ENDOSCOPY;  Service: Endoscopy;  Laterality: N/A;   REFRACTIVE SURGERY Bilateral 2015   Patient Active Problem List   Diagnosis Date Noted   Ocular migraine 01/24/2024   Tick bite of scalp 01/24/2024   Family history of colon cancer in father 12/09/2023   Barrett's esophagus with dysplasia 12/09/2023   Polyp of esophagus 12/09/2023   Obesity with serious comorbidity 08/30/2023   Healthcare maintenance 05/23/2023   Depression with anxiety 05/23/2023   Mixed hyperlipidemia 04/02/2023   Encounter for weight management 10/18/2022   Carpal tunnel syndrome on left 06/12/2022   Low serum vitamin D  05/21/2022   Cervical paraspinal muscle spasm 06/07/2021   Annual physical exam  05/09/2021   Perimenopausal symptoms 05/02/2021   Primary osteoarthritis of hips, bilateral 03/20/2021    PCP: Alvia Selinda PARAS, MD  REFERRING PROVIDER: Alvia Selinda PARAS, MD  REFERRING DIAG: M16.0 (ICD-10-CM) - Primary osteoarthritis of hips, bilateral   THERAPY DIAG:  Pain in right hip  Muscle weakness (generalized)  Decreased range of motion of both hips  Bilateral hip joint arthritis  Rationale for Evaluation and Treatment: Rehabilitation  ONSET DATE: 10 year history of bilateral hip pain  SUBJECTIVE:   SUBJECTIVE STATEMENT: Pt presents to physical therapy with chronic, 10 year history of bilateral hip pain (right significantly > than left at time of eval). Pt reports that pain can increase to a 9/10 at its worst with inactivity, prolonged standing/walking/hiking/climbing stairs. Pt has found relief in symptoms with walking it off after a few minutes or with occasional use of heat. Pt has previously used Tylenol and muscle relaxers to address pain which was mostly unbeneficial for her. Pt denies any distal symptoms or sensations of numbness, tingling, or burning sensations.    PERTINENT HISTORY: Pt currently attends gym 5x a week where she does treadmill walking and general strengthening. Pt would like to increased her LE strength in order to decrease hip pain which she has not had much success with on her own up to this point.   PAIN:  Are you having pain? Yes: NPRS scale: 0/10 current, 9/10 at its worst Pain location: lateral aspect of right hip  Pain description: deep, achy Aggravating factors: Inactivity (especially sleeping), laying on right side, prolonged standing/walking/hiking/stair climbing Relieving factors: Heat, walking it off after 5 minutes  PRECAUTIONS: None  RED FLAGS: None   WEIGHT BEARING RESTRICTIONS: No  FALLS:  Has patient fallen in last 6 months? No  LIVING ENVIRONMENT: Lives with: lives with their family Lives in:  House/apartment Stairs: Yes: Internal: 8 steps; can reach both Has following equipment at home: None  OCCUPATION: Desk job, works from home   PLOF: Independent  PATIENT GOALS: To build up lower extremity strength to decrease hip pain and become independent with gym program to maintain gains made from therapy.   NEXT MD VISIT: PRN  OBJECTIVE:  Note: Objective measures were completed at Evaluation unless otherwise noted.  PATIENT SURVEYS:  LEFs: 56/80  COGNITION: Overall cognitive status: Within functional limits for tasks assessed   SENSATION: Light touch: WFL  MUSCLE LENGTH: Hamstrings: no significant restrictions noted bilaterally   POSTURE: rounded shoulders and forward head  PALPATION: No tenderness or pain noted with palpation to right hip region  LOWER EXTREMITY ROM:  Active ROM Right eval Left eval  Hip flexion 90 deg 95 deg  Hip extension    Hip abduction 70 deg 60 deg  Hip adduction    Hip internal rotation (seated) 18 deg 16 deg  Hip external rotation 40 deg 38 deg  Knee flexion    Knee extension    Ankle dorsiflexion    Ankle plantarflexion    Ankle inversion    Ankle eversion     (Blank rows = not tested)  LOWER EXTREMITY MMT:  MMT Right eval Left eval  Hip flexion 4 4  Hip extension 4- 4-  Hip abduction 4- 4-  Hip adduction 4+ 5  Hip internal rotation    Hip external rotation    Knee flexion 5 5  Knee extension 4+ 4+  Ankle dorsiflexion 4+ 4+  Ankle plantarflexion    Ankle inversion    Ankle eversion     (Blank rows = not tested)  JOINT MOBILITY Short and long axis distraction assessment Right hip: hypomobile, pain decreased  Left hip: hypomobile, pain decreased  LOWER EXTREMITY SPECIAL TESTS:  FADDIR: positive bilaterally SLR Test: negative for provocation of familiar symptoms  FUNCTIONAL TESTS:  5xSTS: 12.54 seconds  GAIT: Distance walked: 65 feet Assistive device utilized: None Level of assistance: Complete  Independence Comments: Pt ambulates with reciprocal gait pattern and decreased arm swing in RUE noted. Pt observed to ambulate with hips positioned slightly in externally rotated position with otherwise unremarkable compensations or significant abnormalities noted.   STAIRS: Pt ascended and descended steps with reciprocal gait pattern and without the use of HRs for UE support. Pt observed to negotiate stairs with hips positioned slightly in externally rotated position with otherwise unremarkable compensations or significant abnormalities noted.  TREATMENT DATE: 03/05/2024   Subjective:  Pt presents to physical therapy with no significant pain in bilateral hips at start of tx session. Pt reports compliance with HE and has continued going to the gym. Pt states that her hips felt very loose and good overall after last tx session and she believes the manual therapy techniques were very beneficial for her.     Therapeutic Exercise:  STS from chair with mirror feedback at //-bars.  Good technique/ hip hinge. Standing hip 3-way 20x each (no ankle wts.) Supine: knee to chest (focus on hip flexion to 90 deg. (Focus on maintaining hip in midline) Supine Bridges, 2 x 10  Updated and issued new HEP  Manual tx.: Supine LE generalized stretches/PROM of bilateral hips (focus on hip IR/ER)- limited R hip IR as compared to L.  Moderate B hip hypomobility noted.   Supine LAD to R LE to increase hip mobility/ decrease pain. Supine R hip grade III-IV mobs. (Inferior/ lateral)- MWM to promote increase L hip flexion/ IR.  3x30 sec. Each (no increase c/o pain).     PATIENT EDUCATION:  Education details: Pt educated on anatomy involved, diagnosis, prognosis, POC, HEP, and current gym program/other recommendations for activity to assist with current right hip pain Person educated:  Patient Education method: Explanation, Demonstration, Tactile cues, Verbal cues, and Handouts Education comprehension: verbalized understanding and returned demonstration  HOME EXERCISE PROGRAM: Access Code: [email or web address] URL: [email or web address] Date: 02/25/2024 Prepared by: Ozell Sero Exercises - Standing Hip Abduction with Counter Support - 1 x daily - 3 x weekly - 2 sets - 15 reps - Standing Hip Extension with Counter Support - 1 x daily - 3 x weekly - 2 sets - 15 reps - Seated March - 1 x daily - 3 x weekly - 2 sets - 15 reps   Access Code: [email or web address] URL: [email or web address] Date: 03/05/2024 Prepared by: Ozell Sero Exercises - Side Stepping with Resistance at Ankles - 1 x daily - 5 x weekly - 2 sets - 12 reps - Standing 3-Way Leg Reach with Resistance at Ankles and Counter Support - 1 x daily - 5 x weekly - 2 sets - 12 reps - Bridge - 1 x daily - 5 x weekly - 2 sets - 12 rep   ASSESSMENT:  CLINICAL IMPRESSION: Patient presents with chronic bilateral hip pain (R>L). Pt continues to be extremely limited with bilateral hip IR (R>L) and noted to externally rotate bilateral hips during passive and active hip flexion in supine position. Continued with manual therapy interventions to address hypomobility and limitations with ROM. Pt responded well to all manual therapy and therapeutic exercise interventions without reporting an increase in pain. Issued and updated a new HEP (see above) and patient verbalized understanding. Will continue to monitor and progress as able.   OBJECTIVE IMPAIRMENTS: Abnormal gait, decreased ROM, decreased strength, hypomobility, and pain.   ACTIVITY LIMITATIONS: carrying, bending, standing, squatting, sleeping, stairs, locomotion level, and caring for others  PARTICIPATION LIMITATIONS: meal prep, cleaning, laundry, shopping, and community activity  PERSONAL FACTORS: Age are also affecting patient's functional outcome.   REHAB POTENTIAL:  Good  CLINICAL DECISION MAKING: Stable/uncomplicated  EVALUATION COMPLEXITY: Low   GOALS: Goals reviewed with patient? Yes  SHORT TERM GOALS: Target date: 03/17/2024 Pt to experience no greater than 5/10 pain at its worst so that she can sleep at night with greater ease.  Baseline:9/10 at worst Goal status: INITIAL  2.  Pt will perform 5xSTS in < 10 seconds to allow  for improvement with functional transfers with greater ease.  Baseline: 12.54 seconds Goal status: INITIAL  LONG TERM GOALS: Target date: 04/07/2024  Pt to increase bilateral hip extension and abduction to at least a 4+/5 to improve ability with prolonged walking.  Baseline: see above Goal status: INITIAL  2.  Pt to increase LEFs score to at least 66 out of 80 to improve self perceived ability to complete daily household tasks without limitations.  Baseline: 56 out of 80 Goal status: INITIAL  3.  Pt to increase right hip flexion AROM to at least 95 degrees to become symmetrical bilaterally and allow for greater ease with climbing stairs.  Baseline: 90 degrees Goal status: INITIAL  PLAN:  PT FREQUENCY: 1-2x/week  PT DURATION: 6 weeks  PLANNED INTERVENTIONS: 97164- PT Re-evaluation, 97110-Therapeutic exercises, 97530- Therapeutic activity, [id number]- Neuromuscular re-education, 97535- Self Care, [id number]- Manual therapy, Patient/Family education, Joint mobilization, Cryotherapy, and Moist heat  PLAN FOR NEXT SESSION: Review response to HEP and current gym program and make adjustments as needed. Progress bridges and hip endurance capacity.   Curtistine Bracket, SPT  Ozell JAYSON Sero, PT, DPT # [id number] 03/05/2024, 12:58 PM

## 2024-03-09 ENCOUNTER — Ambulatory Visit: Admitting: Physical Therapy

## 2024-03-09 DIAGNOSIS — M160 Bilateral primary osteoarthritis of hip: Secondary | ICD-10-CM

## 2024-03-09 DIAGNOSIS — M25551 Pain in right hip: Secondary | ICD-10-CM | POA: Diagnosis not present

## 2024-03-09 DIAGNOSIS — M6281 Muscle weakness (generalized): Secondary | ICD-10-CM

## 2024-03-09 DIAGNOSIS — M25652 Stiffness of left hip, not elsewhere classified: Secondary | ICD-10-CM

## 2024-03-09 DIAGNOSIS — M25651 Stiffness of right hip, not elsewhere classified: Secondary | ICD-10-CM | POA: Diagnosis not present

## 2024-03-09 NOTE — Therapy (Signed)
 OUTPATIENT PHYSICAL THERAPY LOWER EXTREMITY TREATMENT  Patient Name: Alexandra Salinas MRN: [id number] DOB:1975/05/24, 48 y.o., female Today's Date: 03/09/2024  END OF SESSION:  PT End of Session - 03/09/24 1430     Visit Number 4    Number of Visits 12    Date for Recertification  04/07/24    PT Start Time 0230    PT Stop Time 0315    PT Time Calculation (min) 45 min    Activity Tolerance Patient tolerated treatment well;No increased pain    Behavior During Therapy [facility] for tasks assessed/performed           Past Medical History:  Diagnosis Date   Anxiety    Basal cell carcinoma 2023   right forehead, Mohs   Depression    Diverticulitis    GERD (gastroesophageal reflux disease)    H/O basal cell carcinoma excision 05/23/2023   Past Surgical History:  Procedure Laterality Date   BASAL CELL CARCINOMA EXCISION  10/2021   forehead   CESAREAN SECTION  2021   COLONOSCOPY  2020   COLONOSCOPY N/A 12/09/2023   Procedure: COLONOSCOPY;  Surgeon: Jinny Carmine, MD;  Location: [facility] ENDOSCOPY;  Service: Endoscopy;  Laterality: N/A;   ESOPHAGOGASTRODUODENOSCOPY  11/2020   ESOPHAGOGASTRODUODENOSCOPY N/A 12/09/2023   Procedure: EGD (ESOPHAGOGASTRODUODENOSCOPY);  Surgeon: Jinny Carmine, MD;  Location: [facility] ENDOSCOPY;  Service: Endoscopy;  Laterality: N/A;   REFRACTIVE SURGERY Bilateral 2015   Patient Active Problem List   Diagnosis Date Noted   Ocular migraine 01/24/2024   Tick bite of scalp 01/24/2024   Family history of colon cancer in father 12/09/2023   Barrett's esophagus with dysplasia 12/09/2023   Polyp of esophagus 12/09/2023   Obesity with serious comorbidity 08/30/2023   Healthcare maintenance 05/23/2023   Depression with anxiety 05/23/2023   Mixed hyperlipidemia 04/02/2023   Encounter for weight management 10/18/2022   Carpal tunnel syndrome on left 06/12/2022   Low serum vitamin D  05/21/2022   Cervical paraspinal muscle spasm 06/07/2021   Annual physical exam  05/09/2021   Perimenopausal symptoms 05/02/2021   Primary osteoarthritis of hips, bilateral 03/20/2021   PCP: Alvia Selinda PARAS, MD  REFERRING PROVIDER: Alvia Selinda PARAS, MD  REFERRING DIAG: M16.0 (ICD-10-CM) - Primary osteoarthritis of hips, bilateral   THERAPY DIAG:  Pain in right hip  Muscle weakness (generalized)  Decreased range of motion of both hips  Bilateral hip joint arthritis  Rationale for Evaluation and Treatment: Rehabilitation  ONSET DATE: 10 year history of bilateral hip pain  SUBJECTIVE:   SUBJECTIVE STATEMENT: Pt presents to physical therapy with chronic, 10 year history of bilateral hip pain (right significantly > than left at time of eval). Pt reports that pain can increase to a 9/10 at its worst with inactivity, prolonged standing/walking/hiking/climbing stairs. Pt has found relief in symptoms with walking it off after a few minutes or with occasional use of heat. Pt has previously used Tylenol and muscle relaxers to address pain which was mostly unbeneficial for her. Pt denies any distal symptoms or sensations of numbness, tingling, or burning sensations.    PERTINENT HISTORY: Pt currently attends gym 5x a week where she does treadmill walking and general strengthening. Pt would like to increased her LE strength in order to decrease hip pain which she has not had much success with on her own up to this point.   PAIN:  Are you having pain? Yes: NPRS scale: 0/10 current, 9/10 at its worst Pain location: lateral aspect of right hip Pain  description: deep, achy Aggravating factors: Inactivity (especially sleeping), laying on right side, prolonged standing/walking/hiking/stair climbing Relieving factors: Heat, walking it off after 5 minutes  PRECAUTIONS: None  RED FLAGS: None   WEIGHT BEARING RESTRICTIONS: No  FALLS:  Has patient fallen in last 6 months? No  LIVING ENVIRONMENT: Lives with: lives with their family Lives in:  House/apartment Stairs: Yes: Internal: 8 steps; can reach both Has following equipment at home: None  OCCUPATION: Desk job, works from home   PLOF: Independent  PATIENT GOALS: To build up lower extremity strength to decrease hip pain and become independent with gym program to maintain gains made from therapy.   NEXT MD VISIT: PRN  OBJECTIVE:  Note: Objective measures were completed at Evaluation unless otherwise noted.  PATIENT SURVEYS:  LEFs: 56/80  COGNITION: Overall cognitive status: Within functional limits for tasks assessed   SENSATION: Light touch: WFL  MUSCLE LENGTH: Hamstrings: no significant restrictions noted bilaterally   POSTURE: rounded shoulders and forward head  PALPATION: No tenderness or pain noted with palpation to right hip region  LOWER EXTREMITY ROM:  Active ROM Right eval Left eval  Hip flexion 90 deg 95 deg  Hip extension    Hip abduction 70 deg 60 deg  Hip adduction    Hip internal rotation (seated) 18 deg 16 deg  Hip external rotation 40 deg 38 deg  Knee flexion    Knee extension    Ankle dorsiflexion    Ankle plantarflexion    Ankle inversion    Ankle eversion     (Blank rows = not tested)  LOWER EXTREMITY MMT:  MMT Right eval Left eval  Hip flexion 4 4  Hip extension 4- 4-  Hip abduction 4- 4-  Hip adduction 4+ 5  Hip internal rotation    Hip external rotation    Knee flexion 5 5  Knee extension 4+ 4+  Ankle dorsiflexion 4+ 4+  Ankle plantarflexion    Ankle inversion    Ankle eversion     (Blank rows = not tested)  JOINT MOBILITY Short and long axis distraction assessment Right hip: hypomobile, pain decreased  Left hip: hypomobile, pain decreased  LOWER EXTREMITY SPECIAL TESTS:  FADDIR: positive bilaterally SLR Test: negative for provocation of familiar symptoms  FUNCTIONAL TESTS:  5xSTS: 12.54 seconds  GAIT: Distance walked: 65 feet Assistive device utilized: None Level of assistance: Complete  Independence Comments: Pt ambulates with reciprocal gait pattern and decreased arm swing in RUE noted. Pt observed to ambulate with hips positioned slightly in externally rotated position with otherwise unremarkable compensations or significant abnormalities noted.   STAIRS: Pt ascended and descended steps with reciprocal gait pattern and without the use of HRs for UE support. Pt observed to negotiate stairs with hips positioned slightly in externally rotated position with otherwise unremarkable compensations or significant abnormalities noted.  TREATMENT DATE: 03/09/2024   Subjective:  Pt presents to physical therapy with no significant pain in bilateral hips at start of tx session. Pt reports continued compliance with HEP and has continued going to the gym without significant issues.   Therapeutic Activity:  TG Squats, 2 x 10 TRX Squats, 2 x 10 Walking lunges in // bars with 7# Dbs, 4 x down and back Supine single leg Bridges, 2 x 10 bilaterally Lateral resisted walking on Nautilus, 95#, 1 x 4 bilaterally  Manual tx.: Supine LE generalized stretches/PROM of bilateral hips (focus on hip IR/ER)- limited R hip IR as compared to L.  Moderate B hip hypomobility noted.   Supine LAD to R LE to increase hip mobility/ decrease pain. Supine R hip grade III-IV mobs. (Inferior/ lateral)- MWM to promote increase L hip flexion/ IR.  3x30 sec. Each (no increase c/o pain)  HIP IR mobilization with movement in supine position  Not performed today:  Supine: knee to chest (focus on hip flexion to 90 deg. (Focus on maintaining hip in midline) Standing hip 3-way 20x each (no ankle wts.)  PATIENT EDUCATION:  Education details: Pt educated on anatomy involved, diagnosis, prognosis, POC, HEP, and current gym program/other recommendations for activity to assist with current right hip pain Person  educated: Patient Education method: Explanation, Demonstration, Tactile cues, Verbal cues, and Handouts Education comprehension: verbalized understanding and returned demonstration  HOME EXERCISE PROGRAM: Access Code: [email or web address] URL: [email or web address] Date: 02/25/2024 Prepared by: Ozell Sero Exercises - Standing Hip Abduction with Counter Support - 1 x daily - 3 x weekly - 2 sets - 15 reps - Standing Hip Extension with Counter Support - 1 x daily - 3 x weekly - 2 sets - 15 reps - Seated March - 1 x daily - 3 x weekly - 2 sets - 15 reps   Access Code: [email or web address] URL: [email or web address] Date: 03/05/2024 Prepared by: Ozell Sero Exercises - Side Stepping with Resistance at Ankles - 1 x daily - 5 x weekly - 2 sets - 12 reps - Standing 3-Way Leg Reach with Resistance at Ankles and Counter Support - 1 x daily - 5 x weekly - 2 sets - 12 reps - Bridge - 1 x daily - 5 x weekly - 2 sets - 12 rep   ASSESSMENT:  CLINICAL IMPRESSION: Patient presents with chronic bilateral hip pain (R>L). Pt continues to be extremely limited with bilateral hip IR (R>L) and noted to externally rotate bilateral hips during passive and active hip flexion in supine position. Continued with manual therapy interventions to address hypomobility and limitations with ROM and initiated hip IR mobilization with movement in supine position. Pt responded well to all manual therapy and therapeutic exercise interventions without reporting an increase in pain. Pt was progressed to completing supine bridges with single leg, walking lunges with increased resistance, and introduced to lateral resisted walking on Nautilus, all of which she was able to complete without significant difficulty or increase in symptoms. Will continue to monitor and progress as able.   OBJECTIVE IMPAIRMENTS: Abnormal gait, decreased ROM, decreased strength, hypomobility, and pain.   ACTIVITY LIMITATIONS: carrying, bending, standing,  squatting, sleeping, stairs, locomotion level, and caring for others  PARTICIPATION LIMITATIONS: meal prep, cleaning, laundry, shopping, and community activity  PERSONAL FACTORS: Age are also affecting patient's functional outcome.   REHAB POTENTIAL: Good  CLINICAL DECISION MAKING: Stable/uncomplicated  EVALUATION COMPLEXITY: Low   GOALS: Goals reviewed with patient? Yes  SHORT TERM GOALS: Target date: 03/17/2024 Pt to  experience no greater than 5/10 pain at its worst so that she can sleep at night with greater ease.  Baseline:9/10 at worst Goal status: INITIAL  2.  Pt will perform 5xSTS in < 10 seconds to allow for improvement with functional transfers with greater ease.  Baseline: 12.54 seconds Goal status: INITIAL  LONG TERM GOALS: Target date: 04/07/2024  Pt to increase bilateral hip extension and abduction to at least a 4+/5 to improve ability with prolonged walking.  Baseline: see above Goal status: INITIAL  2.  Pt to increase LEFs score to at least 66 out of 80 to improve self perceived ability to complete daily household tasks without limitations.  Baseline: 56 out of 80 Goal status: INITIAL  3.  Pt to increase right hip flexion AROM to at least 95 degrees to become symmetrical bilaterally and allow for greater ease with climbing stairs.  Baseline: 90 degrees Goal status: INITIAL  PLAN:  PT FREQUENCY: 1-2x/week  PT DURATION: 6 weeks  PLANNED INTERVENTIONS: 97164- PT Re-evaluation, 97110-Therapeutic exercises, 97530- Therapeutic activity, [id number]- Neuromuscular re-education, 97535- Self Care, [id number]- Manual therapy, Patient/Family education, Joint mobilization, Cryotherapy, and Moist heat  PLAN FOR NEXT SESSION: Review response to HEP and current gym program and make adjustments as needed.  Load TG squats with increased resistance.  Curtistine Bracket, SPT  Ozell JAYSON Sero, PT, DPT # [phone] 03/09/2024, 3:01 PM

## 2024-03-12 ENCOUNTER — Ambulatory Visit: Admitting: Physical Therapy

## 2024-03-12 DIAGNOSIS — M25652 Stiffness of left hip, not elsewhere classified: Secondary | ICD-10-CM | POA: Diagnosis not present

## 2024-03-12 DIAGNOSIS — M160 Bilateral primary osteoarthritis of hip: Secondary | ICD-10-CM | POA: Diagnosis not present

## 2024-03-12 DIAGNOSIS — M6281 Muscle weakness (generalized): Secondary | ICD-10-CM

## 2024-03-12 DIAGNOSIS — M25551 Pain in right hip: Secondary | ICD-10-CM

## 2024-03-12 DIAGNOSIS — M25651 Stiffness of right hip, not elsewhere classified: Secondary | ICD-10-CM | POA: Diagnosis not present

## 2024-03-12 NOTE — Therapy (Unsigned)
 OUTPATIENT PHYSICAL THERAPY LOWER EXTREMITY TREATMENT  Patient Name: Alexandra Salinas MRN: [id number] DOB:Apr 24, 1976, 48 y.o., female Today's Date: 03/12/2024  END OF SESSION:  PT End of Session - 03/12/24 1257     Visit Number 5    Number of Visits 12    Date for Recertification  04/07/24    PT Start Time 1115    PT Stop Time 1200    PT Time Calculation (min) 45 min    Activity Tolerance Patient tolerated treatment well;No increased pain;Patient limited by fatigue    Behavior During Therapy [facility] for tasks assessed/performed          Past Medical History:  Diagnosis Date   Anxiety    Basal cell carcinoma 2023   right forehead, Mohs   Depression    Diverticulitis    GERD (gastroesophageal reflux disease)    H/O basal cell carcinoma excision 05/23/2023   Past Surgical History:  Procedure Laterality Date   BASAL CELL CARCINOMA EXCISION  10/2021   forehead   CESAREAN SECTION  2021   COLONOSCOPY  2020   COLONOSCOPY N/A 12/09/2023   Procedure: COLONOSCOPY;  Surgeon: Jinny Carmine, MD;  Location: [facility] ENDOSCOPY;  Service: Endoscopy;  Laterality: N/A;   ESOPHAGOGASTRODUODENOSCOPY  11/2020   ESOPHAGOGASTRODUODENOSCOPY N/A 12/09/2023   Procedure: EGD (ESOPHAGOGASTRODUODENOSCOPY);  Surgeon: Jinny Carmine, MD;  Location: [facility] ENDOSCOPY;  Service: Endoscopy;  Laterality: N/A;   REFRACTIVE SURGERY Bilateral 2015   Patient Active Problem List   Diagnosis Date Noted   Ocular migraine 01/24/2024   Tick bite of scalp 01/24/2024   Family history of colon cancer in father 12/09/2023   Barrett's esophagus with dysplasia 12/09/2023   Polyp of esophagus 12/09/2023   Obesity with serious comorbidity 08/30/2023   Healthcare maintenance 05/23/2023   Depression with anxiety 05/23/2023   Mixed hyperlipidemia 04/02/2023   Encounter for weight management 10/18/2022   Carpal tunnel syndrome on left 06/12/2022   Low serum vitamin D  05/21/2022   Cervical paraspinal muscle spasm 06/07/2021    Annual physical exam 05/09/2021   Perimenopausal symptoms 05/02/2021   Primary osteoarthritis of hips, bilateral 03/20/2021   PCP: Alvia Selinda PARAS, MD  REFERRING PROVIDER: Alvia Selinda PARAS, MD  REFERRING DIAG: M16.0 (ICD-10-CM) - Primary osteoarthritis of hips, bilateral   THERAPY DIAG:  Muscle weakness (generalized)  Pain in right hip  Decreased range of motion of both hips  Bilateral hip joint arthritis  Rationale for Evaluation and Treatment: Rehabilitation  ONSET DATE: 10 year history of bilateral hip pain  SUBJECTIVE:   SUBJECTIVE STATEMENT: Pt presents to physical therapy with chronic, 10 year history of bilateral hip pain (right significantly > than left at time of eval). Pt reports that pain can increase to a 9/10 at its worst with inactivity, prolonged standing/walking/hiking/climbing stairs. Pt has found relief in symptoms with walking it off after a few minutes or with occasional use of heat. Pt has previously used Tylenol and muscle relaxers to address pain which was mostly unbeneficial for her. Pt denies any distal symptoms or sensations of numbness, tingling, or burning sensations.    PERTINENT HISTORY: Pt currently attends gym 5x a week where she does treadmill walking and general strengthening. Pt would like to increased her LE strength in order to decrease hip pain which she has not had much success with on her own up to this point.   PAIN:  Are you having pain? Yes: NPRS scale: 0/10 current, 9/10 at its worst Pain location: lateral aspect of right  hip Pain description: deep, achy Aggravating factors: Inactivity (especially sleeping), laying on right side, prolonged standing/walking/hiking/stair climbing Relieving factors: Heat, walking it off after 5 minutes  PRECAUTIONS: None  RED FLAGS: None   WEIGHT BEARING RESTRICTIONS: No  FALLS:  Has patient fallen in last 6 months? No  LIVING ENVIRONMENT: Lives with: lives with their family Lives in:  House/apartment Stairs: Yes: Internal: 8 steps; can reach both Has following equipment at home: None  OCCUPATION: Desk job, works from home   PLOF: Independent  PATIENT GOALS: To build up lower extremity strength to decrease hip pain and become independent with gym program to maintain gains made from therapy.   NEXT MD VISIT: PRN  OBJECTIVE:  Note: Objective measures were completed at Evaluation unless otherwise noted.  PATIENT SURVEYS:  LEFs: 56/80  COGNITION: Overall cognitive status: Within functional limits for tasks assessed   SENSATION: Light touch: WFL  MUSCLE LENGTH: Hamstrings: no significant restrictions noted bilaterally   POSTURE: rounded shoulders and forward head  PALPATION: No tenderness or pain noted with palpation to right hip region  LOWER EXTREMITY ROM:  Active ROM Right eval Left eval  Hip flexion 90 deg 95 deg  Hip extension    Hip abduction 70 deg 60 deg  Hip adduction    Hip internal rotation (seated) 18 deg 16 deg  Hip external rotation 40 deg 38 deg  Knee flexion    Knee extension    Ankle dorsiflexion    Ankle plantarflexion    Ankle inversion    Ankle eversion     (Blank rows = not tested)  LOWER EXTREMITY MMT:  MMT Right eval Left eval  Hip flexion 4 4  Hip extension 4- 4-  Hip abduction 4- 4-  Hip adduction 4+ 5  Hip internal rotation    Hip external rotation    Knee flexion 5 5  Knee extension 4+ 4+  Ankle dorsiflexion 4+ 4+  Ankle plantarflexion    Ankle inversion    Ankle eversion     (Blank rows = not tested)  JOINT MOBILITY Short and long axis distraction assessment Right hip: hypomobile, pain decreased  Left hip: hypomobile, pain decreased  LOWER EXTREMITY SPECIAL TESTS:  FADDIR: positive bilaterally SLR Test: negative for provocation of familiar symptoms  FUNCTIONAL TESTS:  5xSTS: 12.54 seconds  GAIT: Distance walked: 65 feet Assistive device utilized: None Level of assistance: Complete  Independence Comments: Pt ambulates with reciprocal gait pattern and decreased arm swing in RUE noted. Pt observed to ambulate with hips positioned slightly in externally rotated position with otherwise unremarkable compensations or significant abnormalities noted.   STAIRS: Pt ascended and descended steps with reciprocal gait pattern and without the use of HRs for UE support. Pt observed to negotiate stairs with hips positioned slightly in externally rotated position with otherwise unremarkable compensations or significant abnormalities noted.  TREATMENT DATE: 03/12/2024   Subjective:  Pt presents to physical therapy with no significant pain in bilateral hips at start of tx session. Pt reports continued compliance with HEP and has continued going to the gym without significant issues. Pt does note an onset of back pain higher up (thoracic region) that began yesterday and is still irritable some on this day.   Therapeutic Activity:  TG Squats, 30#, 2 x 10 Walking lunges in // bars with 7# Dbs, 4 x down and back Supine single leg Bridges, 2 x 10 bilaterally Lateral resisted walking on Nautilus, 95#, 1 x 4 bilaterally Lateral Step ups to 12-inch stair, 2 x 20 bilaterally   Manual tx.: Supine LE generalized stretches/PROM of bilateral hips (focus on hip IR/ER)- limited R hip IR as compared to L.  Moderate B hip hypomobility noted.   Supine LAD to R LE to increase hip mobility/ decrease pain. Supine R hip grade III-IV mobs. (Inferior/ lateral)- MWM to promote increase L hip flexion/ IR.  3x30 sec. Each (no increase c/o pain)  HIP IR mobilization with movement in supine position  03/12/2024:  Prior to manual therapy interventions                        Right          Left Hip internal rotation (seated) 12 deg 15 deg  Hip external rotation (seated) 20 deg 24 deg          Post  manual therapy interventions        Right          Left Hip internal rotation (seated) 14 deg 20 deg  Hip external rotation (seated) 21 deg 26 deg    Not performed today:  Supine: knee to chest (focus on hip flexion to 90 deg. (Focus on maintaining hip in midline) Standing hip 3-way 20x each (no ankle wts.) TRX Squats, 2 x 10  PATIENT EDUCATION:  Education details: Pt educated on anatomy involved, diagnosis, prognosis, POC, HEP, and current gym program/other recommendations for activity to assist with current right hip pain Person educated: Patient Education method: Explanation, Demonstration, Tactile cues, Verbal cues, and Handouts Education comprehension: verbalized understanding and returned demonstration  HOME EXERCISE PROGRAM: Access Code: [email or web address] URL: [email or web address] Date: 02/25/2024 Prepared by: Ozell Sero Exercises - Standing Hip Abduction with Counter Support - 1 x daily - 3 x weekly - 2 sets - 15 reps - Standing Hip Extension with Counter Support - 1 x daily - 3 x weekly - 2 sets - 15 reps - Seated March - 1 x daily - 3 x weekly - 2 sets - 15 reps   Access Code: [email or web address] URL: [email or web address] Date: 03/05/2024 Prepared by: Ozell Sero Exercises - Side Stepping with Resistance at Ankles - 1 x daily - 5 x weekly - 2 sets - 12 reps - Standing 3-Way Leg Reach with Resistance at Ankles and Counter Support - 1 x daily - 5 x weekly - 2 sets - 12 reps - Bridge - 1 x daily - 5 x weekly - 2 sets - 12 rep   ASSESSMENT:  CLINICAL IMPRESSION: Patient presents with chronic bilateral hip pain (R>L). Continued with manual therapy interventions to address hypomobility and limitations with ROM and initiated hip IR mobilization with movement in supine position. Pt was progressed to completing walking lunges in // bars and lateral walking on Nautilus for increased resistance on this day to further challenge gross LE strength which  she tolerated well. Pt was  introduced to lateral step ups to 12-inch stair to challenge glute med hip strength and endurance capacity which she was able to complete without significant difficulty but was observed to have increased difficulty performing exercise on RLE as compared to LLE. Pt responded well to all manual therapy and therapeutic exercise interventions without reporting an increase in pain at end of tx session. Will continue to monitor and progress as able.   OBJECTIVE IMPAIRMENTS: Abnormal gait, decreased ROM, decreased strength, hypomobility, and pain.   ACTIVITY LIMITATIONS: carrying, bending, standing, squatting, sleeping, stairs, locomotion level, and caring for others  PARTICIPATION LIMITATIONS: meal prep, cleaning, laundry, shopping, and community activity  PERSONAL FACTORS: Age are also affecting patient's functional outcome.   REHAB POTENTIAL: Good  CLINICAL DECISION MAKING: Stable/uncomplicated  EVALUATION COMPLEXITY: Low   GOALS: Goals reviewed with patient? Yes  SHORT TERM GOALS: Target date: 03/17/2024 Pt to experience no greater than 5/10 pain at its worst so that she can sleep at night with greater ease.  Baseline:9/10 at worst Goal status: INITIAL  2.  Pt will perform 5xSTS in < 10 seconds to allow for improvement with functional transfers with greater ease.  Baseline: 12.54 seconds Goal status: INITIAL  LONG TERM GOALS: Target date: 04/07/2024  Pt to increase bilateral hip extension and abduction to at least a 4+/5 to improve ability with prolonged walking.  Baseline: see above Goal status: INITIAL  2.  Pt to increase LEFs score to at least 66 out of 80 to improve self perceived ability to complete daily household tasks without limitations.  Baseline: 56 out of 80 Goal status: INITIAL  3.  Pt to increase right hip flexion AROM to at least 95 degrees to become symmetrical bilaterally and allow for greater ease with climbing stairs.  Baseline: 90 degrees Goal status:  INITIAL  PLAN:  PT FREQUENCY: 1-2x/week  PT DURATION: 6 weeks  PLANNED INTERVENTIONS: 97164- PT Re-evaluation, 97110-Therapeutic exercises, 97530- Therapeutic activity, [id number]- Neuromuscular re-education, 97535- Self Care, [id number]- Manual therapy, Patient/Family education, Joint mobilization, Cryotherapy, and Moist heat  PLAN FOR NEXT SESSION: Review response to HEP and current gym program and make adjustments as needed.   UPDATE STG next tx. session  Curtistine Bracket, SPT  Ozell JAYSON Sero, PT, DPT # [phone] 03/12/2024, 12:59 PM

## 2024-03-13 ENCOUNTER — Other Ambulatory Visit: Payer: Self-pay | Admitting: Psychiatry

## 2024-03-13 ENCOUNTER — Encounter: Payer: Self-pay | Admitting: Physical Therapy

## 2024-03-13 DIAGNOSIS — F3342 Major depressive disorder, recurrent, in full remission: Secondary | ICD-10-CM

## 2024-03-16 ENCOUNTER — Ambulatory Visit: Admitting: Physical Therapy

## 2024-03-16 DIAGNOSIS — M25551 Pain in right hip: Secondary | ICD-10-CM

## 2024-03-16 DIAGNOSIS — M6281 Muscle weakness (generalized): Secondary | ICD-10-CM

## 2024-03-16 DIAGNOSIS — M25651 Stiffness of right hip, not elsewhere classified: Secondary | ICD-10-CM | POA: Diagnosis not present

## 2024-03-16 DIAGNOSIS — M160 Bilateral primary osteoarthritis of hip: Secondary | ICD-10-CM | POA: Diagnosis not present

## 2024-03-16 DIAGNOSIS — M25652 Stiffness of left hip, not elsewhere classified: Secondary | ICD-10-CM | POA: Diagnosis not present

## 2024-03-16 NOTE — Therapy (Unsigned)
 OUTPATIENT PHYSICAL THERAPY LOWER EXTREMITY TREATMENT  Patient Name: Alexandra Salinas MRN: [id number] DOB:[birth year], 48 y.o., female Today's Date: 03/16/2024  END OF SESSION:  PT End of Session - 03/16/24 1431     Visit Number 6    Number of Visits 12    Date for Recertification  04/07/24    PT Start Time 1430    PT Stop Time 1515    PT Time Calculation (min) 45 min    Activity Tolerance Patient tolerated treatment well;No increased pain;Patient limited by fatigue    Behavior During Therapy [facility] for tasks assessed/performed          Past Medical History:  Diagnosis Date   Anxiety    Basal cell carcinoma 2023   right forehead, Mohs   Depression    Diverticulitis    GERD (gastroesophageal reflux disease)    H/O basal cell carcinoma excision 05/23/2023   Past Surgical History:  Procedure Laterality Date   BASAL CELL CARCINOMA EXCISION  10/2021   forehead   CESAREAN SECTION  2021   COLONOSCOPY  2020   COLONOSCOPY N/A 12/09/2023   Procedure: COLONOSCOPY;  Surgeon: Jinny Carmine, MD;  Location: [facility] ENDOSCOPY;  Service: Endoscopy;  Laterality: N/A;   ESOPHAGOGASTRODUODENOSCOPY  11/2020   ESOPHAGOGASTRODUODENOSCOPY N/A 12/09/2023   Procedure: EGD (ESOPHAGOGASTRODUODENOSCOPY);  Surgeon: Jinny Carmine, MD;  Location: [facility] ENDOSCOPY;  Service: Endoscopy;  Laterality: N/A;   REFRACTIVE SURGERY Bilateral 2015   Patient Active Problem List   Diagnosis Date Noted   Ocular migraine 01/24/2024   Tick bite of scalp 01/24/2024   Family history of colon cancer in father 12/09/2023   Barrett's esophagus with dysplasia 12/09/2023   Polyp of esophagus 12/09/2023   Obesity with serious comorbidity 08/30/2023   Healthcare maintenance 05/23/2023   Depression with anxiety 05/23/2023   Mixed hyperlipidemia 04/02/2023   Encounter for weight management 10/18/2022   Carpal tunnel syndrome on left 06/12/2022   Low serum vitamin D  05/21/2022   Cervical paraspinal muscle spasm 06/07/2021    Annual physical exam 05/09/2021   Perimenopausal symptoms 05/02/2021   Primary osteoarthritis of hips, bilateral 03/20/2021   PCP: Alvia Selinda PARAS, MD  REFERRING PROVIDER: Alvia Selinda PARAS, MD  REFERRING DIAG: M16.0 (ICD-10-CM) - Primary osteoarthritis of hips, bilateral   THERAPY DIAG:  No diagnosis found.  Rationale for Evaluation and Treatment: Rehabilitation  ONSET DATE: 10 year history of bilateral hip pain  SUBJECTIVE:   SUBJECTIVE STATEMENT: Pt presents to physical therapy with chronic, 10 year history of bilateral hip pain (right significantly > than left at time of eval). Pt reports that pain can increase to a 9/10 at its worst with inactivity, prolonged standing/walking/hiking/climbing stairs. Pt has found relief in symptoms with walking it off after a few minutes or with occasional use of heat. Pt has previously used Tylenol and muscle relaxers to address pain which was mostly unbeneficial for her. Pt denies any distal symptoms or sensations of numbness, tingling, or burning sensations.    PERTINENT HISTORY: Pt currently attends gym 5x a week where she does treadmill walking and general strengthening. Pt would like to increased her LE strength in order to decrease hip pain which she has not had much success with on her own up to this point.   PAIN:  Are you having pain? Yes: NPRS scale: 0/10 current, 9/10 at its worst Pain location: lateral aspect of right hip Pain description: deep, achy Aggravating factors: Inactivity (especially sleeping), laying on right side, prolonged standing/walking/hiking/stair climbing Relieving  factors: Heat, walking it off after 5 minutes  PRECAUTIONS: None  RED FLAGS: None   WEIGHT BEARING RESTRICTIONS: No  FALLS:  Has patient fallen in last 6 months? No  LIVING ENVIRONMENT: Lives with: lives with their family Lives in: House/apartment Stairs: Yes: Internal: 8 steps; can reach both Has following equipment at home:  None  OCCUPATION: Desk job, works from home   PLOF: Independent  PATIENT GOALS: To build up lower extremity strength to decrease hip pain and become independent with gym program to maintain gains made from therapy.   NEXT MD VISIT: PRN  OBJECTIVE:  Note: Objective measures were completed at Evaluation unless otherwise noted.  PATIENT SURVEYS:  LEFs: 56/80  COGNITION: Overall cognitive status: Within functional limits for tasks assessed   SENSATION: Light touch: WFL  MUSCLE LENGTH: Hamstrings: no significant restrictions noted bilaterally   POSTURE: rounded shoulders and forward head  PALPATION: No tenderness or pain noted with palpation to right hip region  LOWER EXTREMITY ROM:  Active ROM Right eval Left eval  Hip flexion 90 deg 95 deg  Hip extension    Hip abduction 70 deg 60 deg  Hip adduction    Hip internal rotation (seated) 18 deg 16 deg  Hip external rotation 40 deg 38 deg  Knee flexion    Knee extension    Ankle dorsiflexion    Ankle plantarflexion    Ankle inversion    Ankle eversion     (Blank rows = not tested)  LOWER EXTREMITY MMT:  MMT Right eval Left eval  Hip flexion 4 4  Hip extension 4- 4-  Hip abduction 4- 4-  Hip adduction 4+ 5  Hip internal rotation    Hip external rotation    Knee flexion 5 5  Knee extension 4+ 4+  Ankle dorsiflexion 4+ 4+  Ankle plantarflexion    Ankle inversion    Ankle eversion     (Blank rows = not tested)  JOINT MOBILITY Short and long axis distraction assessment Right hip: hypomobile, pain decreased  Left hip: hypomobile, pain decreased  LOWER EXTREMITY SPECIAL TESTS:  FADDIR: positive bilaterally SLR Test: negative for provocation of familiar symptoms  FUNCTIONAL TESTS:  5xSTS: 12.54 seconds  GAIT: Distance walked: 65 feet Assistive device utilized: None Level of assistance: Complete Independence Comments: Pt ambulates with reciprocal gait pattern and decreased arm swing in RUE noted. Pt  observed to ambulate with hips positioned slightly in externally rotated position with otherwise unremarkable compensations or significant abnormalities noted.   STAIRS: Pt ascended and descended steps with reciprocal gait pattern and without the use of HRs for UE support. Pt observed to negotiate stairs with hips positioned slightly in externally rotated position with otherwise unremarkable compensations or significant abnormalities noted.                                                                                                                     TREATMENT DATE: 03/16/2024   Subjective:  Pt presents to physical therapy with  no significant pain in bilateral hips at start of tx session. Pt reports continued compliance with HEP and has continued going to the gym without significant issues. Pt states that she has ongoing discomfort with lying down on right side.   Therapeutic Activity:  Sidelying hip abduction, 4#, 1 x 15 TG Squats, 30#, 2 x 10  Walking lunges in // bars with 7# Dbs, 4 x down and back Lateral Step ups to 12-inch stair, 7# DB, 2 x 20 bilaterally  Walking forward marches in // bars with 5# AWs, 3 x down and back  Manual tx.: Supine LE generalized stretches/PROM of bilateral hips (focus on hip IR/ER)- limited R hip IR as compared to L.  Moderate B hip hypomobility noted.   Supine LAD to R LE to increase hip mobility/ decrease pain. Supine R hip grade III-IV mobs. (Inferior/ lateral)- MWM to promote increase L hip flexion/ IR.  3x30 sec. Each (no increase c/o pain)  Contract-relax technique of left hip to increase IR in supine position  5xSTS: 8.75 seconds  Not performed today:  Supine: knee to chest (focus on hip flexion to 90 deg. (Focus on maintaining hip in midline) Standing hip 3-way 20x each (no ankle wts.) TRX Squats, 2 x 10 Supine single leg Bridges, 2 x 10 bilaterally Lateral resisted walking on Nautilus, 95#, 1 x 4 bilaterally  PATIENT EDUCATION:  Education  details: Pt educated on anatomy involved, diagnosis, prognosis, POC, HEP, and current gym program/other recommendations for activity to assist with current right hip pain Person educated: Patient Education method: Explanation, Demonstration, Tactile cues, Verbal cues, and Handouts Education comprehension: verbalized understanding and returned demonstration  HOME EXERCISE PROGRAM: Access Code: [email or web address] URL: [email or web address] Date: 02/25/2024 Prepared by: Ozell Sero Exercises - Standing Hip Abduction with Counter Support - 1 x daily - 3 x weekly - 2 sets - 15 reps - Standing Hip Extension with Counter Support - 1 x daily - 3 x weekly - 2 sets - 15 reps - Seated March - 1 x daily - 3 x weekly - 2 sets - 15 reps   Access Code: [email or web address] URL: [email or web address] Date: 03/05/2024 Prepared by: Ozell Sero Exercises - Side Stepping with Resistance at Ankles - 1 x daily - 5 x weekly - 2 sets - 12 reps - Standing 3-Way Leg Reach with Resistance at Ankles and Counter Support - 1 x daily - 5 x weekly - 2 sets - 12 reps - Bridge - 1 x daily - 5 x weekly - 2 sets - 12 rep   ASSESSMENT:  CLINICAL IMPRESSION: Patient presents with chronic bilateral hip pain (R>L). Continued with manual therapy interventions to address hypomobility and limitations with ROM and initiated hip IR mobilization with movement in supine position. Updated 5xSTS which patient completed in 8.75 seconds. Pt was introduced to completing forward walking marches in // bars with 5# AWs which she tolerated well but required verbal cueing to prevent compensation of moving into lumbar extension. Pt was progressed to completing lateral step ups to 12-inch stair with increased resistance (7# DB) on this day which she was able to complete without significant difficulty and slightly increased ability to perform exercise on RLE. Pt responded well to all manual therapy and therapeutic exercise interventions without  reporting an increase in pain at end of tx session. Will continue to monitor and progress as able.   OBJECTIVE IMPAIRMENTS: Abnormal gait, decreased ROM, decreased strength, hypomobility, and pain.   ACTIVITY LIMITATIONS: carrying, bending, standing, squatting, sleeping, stairs,  locomotion level, and caring for others  PARTICIPATION LIMITATIONS: meal prep, cleaning, laundry, shopping, and community activity  PERSONAL FACTORS: Age are also affecting patient's functional outcome.   REHAB POTENTIAL: Good  CLINICAL DECISION MAKING: Stable/uncomplicated  EVALUATION COMPLEXITY: Low   GOALS: Goals reviewed with patient? Yes  SHORT TERM GOALS: Target date: 03/17/2024 Pt to experience no greater than 5/10 pain at its worst so that she can sleep at night with greater ease.  Baseline:9/10 at worst; (10/27): 4/10 at worst Goal status: GOAL MET  2.  Pt will perform 5xSTS in < 10 seconds to allow for improvement with functional transfers with greater ease.  Baseline: 12.54 seconds; (10/27): 8.75 seconds  Goal status: GOAL MET  LONG TERM GOALS: Target date: 04/07/2024  Pt to increase bilateral hip extension and abduction to at least a 4+/5 to improve ability with prolonged walking.  Baseline: see above Goal status: INITIAL  2.  Pt to increase LEFs score to at least 66 out of 80 to improve self perceived ability to complete daily household tasks without limitations.  Baseline: 56 out of 80 Goal status: INITIAL  3.  Pt to increase right hip flexion AROM to at least 95 degrees to become symmetrical bilaterally and allow for greater ease with climbing stairs.  Baseline: 90 degrees Goal status: INITIAL  PLAN:  PT FREQUENCY: 1-2x/week  PT DURATION: 6 weeks  PLANNED INTERVENTIONS: 97164- PT Re-evaluation, 97110-Therapeutic exercises, 97530- Therapeutic activity, [id number]- Neuromuscular re-education, 97535- Self Care, [id number]- Manual therapy, Patient/Family education, Joint mobilization,  Cryotherapy, and Moist heat  PLAN FOR NEXT SESSION: Review response to HEP and current gym program and make adjustments as needed.    Curtistine Bracket, SPT  Ozell JAYSON Sero, PT, DPT # [phone] 03/16/2024, 2:32 PM

## 2024-03-19 ENCOUNTER — Ambulatory Visit: Admitting: Physical Therapy

## 2024-03-19 DIAGNOSIS — M25651 Stiffness of right hip, not elsewhere classified: Secondary | ICD-10-CM | POA: Diagnosis not present

## 2024-03-19 DIAGNOSIS — M160 Bilateral primary osteoarthritis of hip: Secondary | ICD-10-CM | POA: Diagnosis not present

## 2024-03-19 DIAGNOSIS — M25652 Stiffness of left hip, not elsewhere classified: Secondary | ICD-10-CM | POA: Diagnosis not present

## 2024-03-19 DIAGNOSIS — M6281 Muscle weakness (generalized): Secondary | ICD-10-CM | POA: Diagnosis not present

## 2024-03-19 DIAGNOSIS — M25551 Pain in right hip: Secondary | ICD-10-CM

## 2024-03-19 NOTE — Therapy (Unsigned)
 OUTPATIENT PHYSICAL THERAPY LOWER EXTREMITY TREATMENT  Patient Name: Alexandra Salinas MRN: [id number] DOB:[birth year], 48 y.o., female Today's Date: 03/19/2024  END OF SESSION:  PT End of Session - 03/19/24 1300     Visit Number 7    Number of Visits 12    Date for Recertification  04/07/24    PT Start Time 1115    PT Stop Time 1200    PT Time Calculation (min) 45 min    Activity Tolerance No increased pain;Patient limited by fatigue;Patient tolerated treatment well    Behavior During Therapy [facility] for tasks assessed/performed          Past Medical History:  Diagnosis Date   Anxiety    Basal cell carcinoma 2023   right forehead, Mohs   Depression    Diverticulitis    GERD (gastroesophageal reflux disease)    H/O basal cell carcinoma excision 05/23/2023   Past Surgical History:  Procedure Laterality Date   BASAL CELL CARCINOMA EXCISION  10/2021   forehead   CESAREAN SECTION  2021   COLONOSCOPY  2020   COLONOSCOPY N/A 12/09/2023   Procedure: COLONOSCOPY;  Surgeon: Jinny Carmine, MD;  Location: [facility] ENDOSCOPY;  Service: Endoscopy;  Laterality: N/A;   ESOPHAGOGASTRODUODENOSCOPY  11/2020   ESOPHAGOGASTRODUODENOSCOPY N/A 12/09/2023   Procedure: EGD (ESOPHAGOGASTRODUODENOSCOPY);  Surgeon: Jinny Carmine, MD;  Location: [facility] ENDOSCOPY;  Service: Endoscopy;  Laterality: N/A;   REFRACTIVE SURGERY Bilateral 2015   Patient Active Problem List   Diagnosis Date Noted   Ocular migraine 01/24/2024   Tick bite of scalp 01/24/2024   Family history of colon cancer in father 12/09/2023   Barrett's esophagus with dysplasia 12/09/2023   Polyp of esophagus 12/09/2023   Obesity with serious comorbidity 08/30/2023   Healthcare maintenance 05/23/2023   Depression with anxiety 05/23/2023   Mixed hyperlipidemia 04/02/2023   Encounter for weight management 10/18/2022   Carpal tunnel syndrome on left 06/12/2022   Low serum vitamin D  05/21/2022   Cervical paraspinal muscle spasm 06/07/2021    Annual physical exam 05/09/2021   Perimenopausal symptoms 05/02/2021   Primary osteoarthritis of hips, bilateral 03/20/2021   PCP: Alvia Selinda PARAS, MD  REFERRING PROVIDER: Alvia Selinda PARAS, MD  REFERRING DIAG: M16.0 (ICD-10-CM) - Primary osteoarthritis of hips, bilateral   THERAPY DIAG:  Muscle weakness (generalized)  Pain in right hip  Decreased range of motion of both hips  Bilateral hip joint arthritis  Rationale for Evaluation and Treatment: Rehabilitation  ONSET DATE: 10 year history of bilateral hip pain  SUBJECTIVE:   SUBJECTIVE STATEMENT: Pt presents to physical therapy with chronic, 10 year history of bilateral hip pain (right significantly > than left at time of eval). Pt reports that pain can increase to a 9/10 at its worst with inactivity, prolonged standing/walking/hiking/climbing stairs. Pt has found relief in symptoms with walking it off after a few minutes or with occasional use of heat. Pt has previously used Tylenol and muscle relaxers to address pain which was mostly unbeneficial for her. Pt denies any distal symptoms or sensations of numbness, tingling, or burning sensations.    PERTINENT HISTORY: Pt currently attends gym 5x a week where she does treadmill walking and general strengthening. Pt would like to increased her LE strength in order to decrease hip pain which she has not had much success with on her own up to this point.   PAIN:  Are you having pain? Yes: NPRS scale: 0/10 current, 9/10 at its worst Pain location: lateral aspect of right  hip Pain description: deep, achy Aggravating factors: Inactivity (especially sleeping), laying on right side, prolonged standing/walking/hiking/stair climbing Relieving factors: Heat, walking it off after 5 minutes  PRECAUTIONS: None  RED FLAGS: None   WEIGHT BEARING RESTRICTIONS: No  FALLS:  Has patient fallen in last 6 months? No  LIVING ENVIRONMENT: Lives with: lives with their family Lives in:  House/apartment Stairs: Yes: Internal: 8 steps; can reach both Has following equipment at home: None  OCCUPATION: Desk job, works from home   PLOF: Independent  PATIENT GOALS: To build up lower extremity strength to decrease hip pain and become independent with gym program to maintain gains made from therapy.   NEXT MD VISIT: PRN  OBJECTIVE:  Note: Objective measures were completed at Evaluation unless otherwise noted.  PATIENT SURVEYS:  LEFs: 56/80  COGNITION: Overall cognitive status: Within functional limits for tasks assessed   SENSATION: Light touch: WFL  MUSCLE LENGTH: Hamstrings: no significant restrictions noted bilaterally   POSTURE: rounded shoulders and forward head  PALPATION: No tenderness or pain noted with palpation to right hip region  LOWER EXTREMITY ROM:  Active ROM Right eval Left eval  Hip flexion 90 deg 95 deg  Hip extension    Hip abduction 70 deg 60 deg  Hip adduction    Hip internal rotation (seated) 18 deg 16 deg  Hip external rotation 40 deg 38 deg  Knee flexion    Knee extension    Ankle dorsiflexion    Ankle plantarflexion    Ankle inversion    Ankle eversion     (Blank rows = not tested)  LOWER EXTREMITY MMT:  MMT Right eval Left eval  Hip flexion 4 4  Hip extension 4- 4-  Hip abduction 4- 4-  Hip adduction 4+ 5  Hip internal rotation    Hip external rotation    Knee flexion 5 5  Knee extension 4+ 4+  Ankle dorsiflexion 4+ 4+  Ankle plantarflexion    Ankle inversion    Ankle eversion     (Blank rows = not tested)  JOINT MOBILITY Short and long axis distraction assessment Right hip: hypomobile, pain decreased  Left hip: hypomobile, pain decreased  LOWER EXTREMITY SPECIAL TESTS:  FADDIR: positive bilaterally SLR Test: negative for provocation of familiar symptoms  FUNCTIONAL TESTS:  5xSTS: 12.54 seconds  GAIT: Distance walked: 65 feet Assistive device utilized: None Level of assistance: Complete  Independence Comments: Pt ambulates with reciprocal gait pattern and decreased arm swing in RUE noted. Pt observed to ambulate with hips positioned slightly in externally rotated position with otherwise unremarkable compensations or significant abnormalities noted.   STAIRS: Pt ascended and descended steps with reciprocal gait pattern and without the use of HRs for UE support. Pt observed to negotiate stairs with hips positioned slightly in externally rotated position with otherwise unremarkable compensations or significant abnormalities noted. 5xSTS: 8.75 seconds  TREATMENT DATE: 03/19/2024   Subjective:  Pt presents to physical therapy with no significant pain in bilateral hips at start of tx session. Pt reports continued compliance with HEP and has continued going to the gym without significant issues.  Pt. Has car trip to [place] tomorrow.    Therapeutic Activity:  Sidelying hip abduction in modified plank position, 2 x 15 Supine SL Bridges against green TB resistance, 2 x 10  TG Squats, 30#, 2 x 10  Lateral resisted walking on Nautilus, 110#, 1 x 4 bilaterally Walking forward marches in // bars with 5# AWs, 3 x down and back  Manual tx.: Supine LE generalized stretches/PROM of bilateral hips (focus on hip IR/ER)- limited R hip IR as compared to L.  Moderate B hip hypomobility noted.   Supine LAD to R LE to increase hip mobility/ decrease pain. Supine R hip grade III-IV mobs. (Inferior/ lateral)- MWM to promote increase L hip flexion/ IR.  3x30 sec. Each (no increase c/o pain)  Contract-relax technique of left hip to increase IR in supine position  Not performed today:  Supine: knee to chest (focus on hip flexion to 90 deg. (Focus on maintaining hip in midline) Standing hip 3-way 20x each (no ankle wts.) TRX Squats, 2 x 10 Supine single leg Bridges, 2 x 10 bilaterally Walking  lunges in // bars with 7# Dbs, 4 x down and back Lateral Step ups to 12-inch stair, 7# DB, 2 x 20 bilaterally   PATIENT EDUCATION:  Education details: Pt educated on anatomy involved, diagnosis, prognosis, POC, HEP, and current gym program/other recommendations for activity to assist with current right hip pain Person educated: Patient Education method: Explanation, Demonstration, Tactile cues, Verbal cues, and Handouts Education comprehension: verbalized understanding and returned demonstration  HOME EXERCISE PROGRAM: Access Code: [email or web address] URL: [email or web address] Date: 02/25/2024 Prepared by: Ozell Sero Exercises - Standing Hip Abduction with Counter Support - 1 x daily - 3 x weekly - 2 sets - 15 reps - Standing Hip Extension with Counter Support - 1 x daily - 3 x weekly - 2 sets - 15 reps - Seated March - 1 x daily - 3 x weekly - 2 sets - 15 reps   Access Code: [email or web address] URL: [email or web address] Date: 03/05/2024 Prepared by: Ozell Sero Exercises - Side Stepping with Resistance at Ankles - 1 x daily - 5 x weekly - 2 sets - 12 reps - Standing 3-Way Leg Reach with Resistance at Ankles and Counter Support - 1 x daily - 5 x weekly - 2 sets - 12 reps - Bridge - 1 x daily - 5 x weekly - 2 sets - 12 rep   ASSESSMENT:  CLINICAL IMPRESSION: Patient presents without bilateral hip pain on this day. Continued with manual therapy interventions to address hypomobility and limitations with ROM with R hip being more limited than L in passive flexion and IR. Pt was progressed to completing sidelying hip abduction exercise in modified plank position which she tolerated well but was noted to be limited with right hip abduction secondary to deficits in AROM of right hip. Pt was also progressed to completing single leg bridge against green TB resistance and increased resistance with lateral resisted walking on Nautilus which she was able to complete without significant difficulty  or pain. Pt responded well to all manual therapy and therapeutic exercise interventions without reporting an increase in pain at end of tx session. Will continue to monitor and progress as able.   OBJECTIVE IMPAIRMENTS: Abnormal gait, decreased  ROM, decreased strength, hypomobility, and pain.   ACTIVITY LIMITATIONS: carrying, bending, standing, squatting, sleeping, stairs, locomotion level, and caring for others  PARTICIPATION LIMITATIONS: meal prep, cleaning, laundry, shopping, and community activity  PERSONAL FACTORS: Age are also affecting patient's functional outcome.   REHAB POTENTIAL: Good  CLINICAL DECISION MAKING: Stable/uncomplicated  EVALUATION COMPLEXITY: Low   GOALS: Goals reviewed with patient? Yes  SHORT TERM GOALS: Target date: 03/17/2024 Pt to experience no greater than 5/10 pain at its worst so that she can sleep at night with greater ease.  Baseline:9/10 at worst; (10/27): 4/10 at worst Goal status: GOAL MET  2.  Pt will perform 5xSTS in < 10 seconds to allow for improvement with functional transfers with greater ease.  Baseline: 12.54 seconds; (10/27): 8.75 seconds  Goal status: GOAL MET  LONG TERM GOALS: Target date: 04/07/2024  Pt to increase bilateral hip extension and abduction to at least a 4+/5 to improve ability with prolonged walking.  Baseline: see above Goal status: INITIAL  2.  Pt to increase LEFs score to at least 66 out of 80 to improve self perceived ability to complete daily household tasks without limitations.  Baseline: 56 out of 80 Goal status: INITIAL  3.  Pt to increase right hip flexion AROM to at least 95 degrees to become symmetrical bilaterally and allow for greater ease with climbing stairs.  Baseline: 90 degrees Goal status: INITIAL  PLAN:  PT FREQUENCY: 1-2x/week  PT DURATION: 6 weeks  PLANNED INTERVENTIONS: 97164- PT Re-evaluation, 97110-Therapeutic exercises, 97530- Therapeutic activity, [id number]- Neuromuscular  re-education, 97535- Self Care, [id number]- Manual therapy, Patient/Family education, Joint mobilization, Cryotherapy, and Moist heat  PLAN FOR NEXT SESSION: Review response to HEP and current gym program and make adjustments as needed.  Discuss trip  Curtistine Bracket, SPT  Ozell JAYSON Sero, PT, DPT # [phone] 03/19/2024, 1:01 PM

## 2024-03-26 ENCOUNTER — Ambulatory Visit: Attending: Family Medicine | Admitting: Physical Therapy

## 2024-03-26 ENCOUNTER — Encounter: Payer: Self-pay | Admitting: Physical Therapy

## 2024-03-26 DIAGNOSIS — M25651 Stiffness of right hip, not elsewhere classified: Secondary | ICD-10-CM | POA: Diagnosis not present

## 2024-03-26 DIAGNOSIS — M160 Bilateral primary osteoarthritis of hip: Secondary | ICD-10-CM | POA: Diagnosis not present

## 2024-03-26 DIAGNOSIS — M25551 Pain in right hip: Secondary | ICD-10-CM | POA: Insufficient documentation

## 2024-03-26 DIAGNOSIS — M6281 Muscle weakness (generalized): Secondary | ICD-10-CM | POA: Diagnosis not present

## 2024-03-26 DIAGNOSIS — M25652 Stiffness of left hip, not elsewhere classified: Secondary | ICD-10-CM | POA: Diagnosis not present

## 2024-03-26 NOTE — Therapy (Signed)
 OUTPATIENT PHYSICAL THERAPY LOWER EXTREMITY TREATMENT  Patient Name: Alexandra Salinas MRN: [id number] DOB:[birth year], 48 y.o., female Today's Date: 03/26/2024  END OF SESSION:  PT End of Session - 03/26/24 1159     Visit Number 8    Number of Visits 12    Date for Recertification  04/07/24    PT Start Time 1115    PT Stop Time 1200    PT Time Calculation (min) 45 min    Activity Tolerance No increased pain;Patient limited by fatigue;Patient tolerated treatment well    Behavior During Therapy [facility] for tasks assessed/performed          Past Medical History:  Diagnosis Date   Anxiety    Basal cell carcinoma 2023   right forehead, Mohs   Depression    Diverticulitis    GERD (gastroesophageal reflux disease)    H/O basal cell carcinoma excision 05/23/2023   Past Surgical History:  Procedure Laterality Date   BASAL CELL CARCINOMA EXCISION  10/2021   forehead   CESAREAN SECTION  2021   COLONOSCOPY  2020   COLONOSCOPY N/A 12/09/2023   Procedure: COLONOSCOPY;  Surgeon: Jinny Carmine, MD;  Location: [facility] ENDOSCOPY;  Service: Endoscopy;  Laterality: N/A;   ESOPHAGOGASTRODUODENOSCOPY  11/2020   ESOPHAGOGASTRODUODENOSCOPY N/A 12/09/2023   Procedure: EGD (ESOPHAGOGASTRODUODENOSCOPY);  Surgeon: Jinny Carmine, MD;  Location: [facility] ENDOSCOPY;  Service: Endoscopy;  Laterality: N/A;   REFRACTIVE SURGERY Bilateral 2015   Patient Active Problem List   Diagnosis Date Noted   Ocular migraine 01/24/2024   Tick bite of scalp 01/24/2024   Family history of colon cancer in father 12/09/2023   Barrett's esophagus with dysplasia 12/09/2023   Polyp of esophagus 12/09/2023   Obesity with serious comorbidity 08/30/2023   Healthcare maintenance 05/23/2023   Depression with anxiety 05/23/2023   Mixed hyperlipidemia 04/02/2023   Encounter for weight management 10/18/2022   Carpal tunnel syndrome on left 06/12/2022   Low serum vitamin D  05/21/2022   Cervical paraspinal muscle spasm 06/07/2021    Annual physical exam 05/09/2021   Perimenopausal symptoms 05/02/2021   Primary osteoarthritis of hips, bilateral 03/20/2021   PCP: Alvia Selinda PARAS, MD  REFERRING PROVIDER: Alvia Selinda PARAS, MD  REFERRING DIAG: M16.0 (ICD-10-CM) - Primary osteoarthritis of hips, bilateral   THERAPY DIAG:  Muscle weakness (generalized)  Pain in right hip  Decreased range of motion of both hips  Bilateral hip joint arthritis  Rationale for Evaluation and Treatment: Rehabilitation  ONSET DATE: 10 year history of bilateral hip pain  SUBJECTIVE:   SUBJECTIVE STATEMENT: Pt presents to physical therapy with chronic, 10 year history of bilateral hip pain (right significantly > than left at time of eval). Pt reports that pain can increase to a 9/10 at its worst with inactivity, prolonged standing/walking/hiking/climbing stairs. Pt has found relief in symptoms with walking it off after a few minutes or with occasional use of heat. Pt has previously used Tylenol and muscle relaxers to address pain which was mostly unbeneficial for her. Pt denies any distal symptoms or sensations of numbness, tingling, or burning sensations.    PERTINENT HISTORY: Pt currently attends gym 5x a week where she does treadmill walking and general strengthening. Pt would like to increased her LE strength in order to decrease hip pain which she has not had much success with on her own up to this point.   PAIN:  Are you having pain? Yes: NPRS scale: 0/10 current, 9/10 at its worst Pain location: lateral aspect of right  hip Pain description: deep, achy Aggravating factors: Inactivity (especially sleeping), laying on right side, prolonged standing/walking/hiking/stair climbing Relieving factors: Heat, walking it off after 5 minutes  PRECAUTIONS: None  RED FLAGS: None   WEIGHT BEARING RESTRICTIONS: No  FALLS:  Has patient fallen in last 6 months? No  LIVING ENVIRONMENT: Lives with: lives with their family Lives in:  House/apartment Stairs: Yes: Internal: 8 steps; can reach both Has following equipment at home: None  OCCUPATION: Desk job, works from home   PLOF: Independent  PATIENT GOALS: To build up lower extremity strength to decrease hip pain and become independent with gym program to maintain gains made from therapy.   NEXT MD VISIT: PRN  OBJECTIVE:  Note: Objective measures were completed at Evaluation unless otherwise noted.  PATIENT SURVEYS:  LEFs: 56/80  COGNITION: Overall cognitive status: Within functional limits for tasks assessed   SENSATION: Light touch: WFL  MUSCLE LENGTH: Hamstrings: no significant restrictions noted bilaterally   POSTURE: rounded shoulders and forward head  PALPATION: No tenderness or pain noted with palpation to right hip region  LOWER EXTREMITY ROM:  Active ROM Right eval Left eval  Hip flexion 90 deg 95 deg  Hip extension    Hip abduction 70 deg 60 deg  Hip adduction    Hip internal rotation (seated) 18 deg 16 deg  Hip external rotation 40 deg 38 deg  Knee flexion    Knee extension    Ankle dorsiflexion    Ankle plantarflexion    Ankle inversion    Ankle eversion     (Blank rows = not tested)  LOWER EXTREMITY MMT:  MMT Right eval Left eval  Hip flexion 4 4  Hip extension 4- 4-  Hip abduction 4- 4-  Hip adduction 4+ 5  Hip internal rotation    Hip external rotation    Knee flexion 5 5  Knee extension 4+ 4+  Ankle dorsiflexion 4+ 4+  Ankle plantarflexion    Ankle inversion    Ankle eversion     (Blank rows = not tested)  JOINT MOBILITY Short and long axis distraction assessment Right hip: hypomobile, pain decreased  Left hip: hypomobile, pain decreased  LOWER EXTREMITY SPECIAL TESTS:  FADDIR: positive bilaterally SLR Test: negative for provocation of familiar symptoms  FUNCTIONAL TESTS:  5xSTS: 12.54 seconds  GAIT: Distance walked: 65 feet Assistive device utilized: None Level of assistance: Complete  Independence Comments: Pt ambulates with reciprocal gait pattern and decreased arm swing in RUE noted. Pt observed to ambulate with hips positioned slightly in externally rotated position with otherwise unremarkable compensations or significant abnormalities noted.   STAIRS: Pt ascended and descended steps with reciprocal gait pattern and without the use of HRs for UE support. Pt observed to negotiate stairs with hips positioned slightly in externally rotated position with otherwise unremarkable compensations or significant abnormalities noted. 5xSTS: 8.75 seconds  TREATMENT DATE: 03/26/2024   Subjective:  Pt presents to physical therapy with no significant pain in bilateral hips at start of tx session. Pt states that her hips felt great throughout her trip to [place], including the 11+ hour car rides without any increase in pain.   Therapeutic Activity:  Prone hip extensions with min manual resistance from therapist, 2 x 8 bilaterally Sidelying hip abduction in modified plank position, 2 x 15 Supine SL Bridges, 2 x 8 TG single leg Squats, 30#, 2 x 8 bilaterally  Walking forward marches in // bars with 5# AWs, 3 x down and back Lateral walking in // bars with blue TB above ankles, 3 x down and back  Manual tx.: Supine LE generalized stretches/PROM of bilateral hips (focus on hip IR/ER)- limited R hip IR as compared to L.  Moderate B hip hypomobility noted.   Supine LAD to R LE to increase hip mobility/ decrease pain. Supine R hip grade III-IV mobs. (Inferior/ lateral)- MWM to promote increase L hip flexion/ IR.  3x30 sec. Each (no increase c/o pain)  Contract-relax technique of left hip to increase IR in supine position  Not performed today:  Supine: knee to chest (focus on hip flexion to 90 deg. (Focus on maintaining hip in midline) Standing hip 3-way 20x each (no ankle wts.) TRX  Squats, 2 x 10 Supine single leg Bridges, 2 x 10 bilaterally Walking lunges in // bars with 7# Dbs, 4 x down and back Lateral Step ups to 12-inch stair, 7# DB, 2 x 20 bilaterally  Lateral resisted walking on Nautilus, 110#, 1 x 4 bilaterally  PATIENT EDUCATION:  Education details: Pt educated on anatomy involved, diagnosis, prognosis, POC, HEP, and current gym program/other recommendations for activity to assist with current right hip pain Person educated: Patient Education method: Explanation, Demonstration, Tactile cues, Verbal cues, and Handouts Education comprehension: verbalized understanding and returned demonstration  HOME EXERCISE PROGRAM: Access Code: [email or web address] URL: [email or web address] Date: 02/25/2024 Prepared by: Ozell Sero Exercises - Standing Hip Abduction with Counter Support - 1 x daily - 3 x weekly - 2 sets - 15 reps - Standing Hip Extension with Counter Support - 1 x daily - 3 x weekly - 2 sets - 15 reps - Seated March - 1 x daily - 3 x weekly - 2 sets - 15 reps   Access Code: [email or web address] URL: [email or web address] Date: 03/05/2024 Prepared by: Ozell Sero Exercises - Side Stepping with Resistance at Ankles - 1 x daily - 5 x weekly - 2 sets - 12 reps - Standing 3-Way Leg Reach with Resistance at Ankles and Counter Support - 1 x daily - 5 x weekly - 2 sets - 12 reps - Bridge - 1 x daily - 5 x weekly - 2 sets - 12 rep   ASSESSMENT:  CLINICAL IMPRESSION: Patient presents without bilateral hip pain on this day. Continued with manual therapy interventions to address hypomobility and limitations with ROM as R hip continues to be more limited than L, particularly with flexion and IR. Pt was introduced to prone hip extensions with min manual resistance from therapist which she tolerated well but was limited by AROM of bilateral hip extension. Pt was also progressed to completing TG squats with single leg only which she was able to complete. Pt responded  well to all manual therapy and therapeutic exercise interventions without reporting an increase in pain at end of tx session. Will continue to monitor and progress as able.   OBJECTIVE IMPAIRMENTS: Abnormal  gait, decreased ROM, decreased strength, hypomobility, and pain.   ACTIVITY LIMITATIONS: carrying, bending, standing, squatting, sleeping, stairs, locomotion level, and caring for others  PARTICIPATION LIMITATIONS: meal prep, cleaning, laundry, shopping, and community activity  PERSONAL FACTORS: Age are also affecting patient's functional outcome.   REHAB POTENTIAL: Good  CLINICAL DECISION MAKING: Stable/uncomplicated  EVALUATION COMPLEXITY: Low   GOALS: Goals reviewed with patient? Yes  SHORT TERM GOALS: Target date: 03/17/2024 Pt to experience no greater than 5/10 pain at its worst so that she can sleep at night with greater ease.  Baseline:9/10 at worst; (10/27): 4/10 at worst Goal status: GOAL MET  2.  Pt will perform 5xSTS in < 10 seconds to allow for improvement with functional transfers with greater ease.  Baseline: 12.54 seconds; (10/27): 8.75 seconds  Goal status: GOAL MET  LONG TERM GOALS: Target date: 04/07/2024  Pt to increase bilateral hip extension and abduction to at least a 4+/5 to improve ability with prolonged walking.  Baseline: see above Goal status: INITIAL  2.  Pt to increase LEFs score to at least 66 out of 80 to improve self perceived ability to complete daily household tasks without limitations.  Baseline: 56 out of 80 Goal status: INITIAL  3.  Pt to increase right hip flexion AROM to at least 95 degrees to become symmetrical bilaterally and allow for greater ease with climbing stairs.  Baseline: 90 degrees Goal status: INITIAL  PLAN:  PT FREQUENCY: 1-2x/week  PT DURATION: 6 weeks  PLANNED INTERVENTIONS: 97164- PT Re-evaluation, 97110-Therapeutic exercises, 97530- Therapeutic activity, [id number]- Neuromuscular re-education, 97535- Self Care,  97140- Manual therapy, Patient/Family education, Joint mobilization, Cryotherapy, and Moist heat  PLAN FOR NEXT SESSION: Review response to HEP and current gym program and make adjustments as needed.  Add resistance to sidelying hip abduction in modified plank position. Introduce squats on BOSU.    Curtistine Bracket, SPT  Ozell JAYSON Sero, PT, DPT # [phone] 03/26/2024, 12:02 PM

## 2024-03-31 ENCOUNTER — Ambulatory Visit: Admitting: Physical Therapy

## 2024-03-31 DIAGNOSIS — M6281 Muscle weakness (generalized): Secondary | ICD-10-CM

## 2024-03-31 DIAGNOSIS — M160 Bilateral primary osteoarthritis of hip: Secondary | ICD-10-CM | POA: Diagnosis not present

## 2024-03-31 DIAGNOSIS — M25652 Stiffness of left hip, not elsewhere classified: Secondary | ICD-10-CM | POA: Diagnosis not present

## 2024-03-31 DIAGNOSIS — M25651 Stiffness of right hip, not elsewhere classified: Secondary | ICD-10-CM

## 2024-03-31 DIAGNOSIS — M25551 Pain in right hip: Secondary | ICD-10-CM | POA: Diagnosis not present

## 2024-03-31 NOTE — Therapy (Unsigned)
 OUTPATIENT PHYSICAL THERAPY LOWER EXTREMITY TREATMENT  Patient Name: Alexandra Salinas MRN: [id number] DOB:[birth year], 48 y.o., female Today's Date: 04/01/2024  END OF SESSION:  PT End of Session - 03/31/24 1143     Visit Number 9    Number of Visits 12    Date for Recertification  04/07/24    PT Start Time 1116    PT Stop Time 1200    PT Time Calculation (min) 44 min    Activity Tolerance Patient limited by fatigue;Patient tolerated treatment well;No increased pain    Behavior During Therapy [facility] for tasks assessed/performed           Past Medical History:  Diagnosis Date   Anxiety    Basal cell carcinoma 2023   right forehead, Mohs   Depression    Diverticulitis    GERD (gastroesophageal reflux disease)    H/O basal cell carcinoma excision 05/23/2023   Past Surgical History:  Procedure Laterality Date   BASAL CELL CARCINOMA EXCISION  10/2021   forehead   CESAREAN SECTION  2021   COLONOSCOPY  2020   COLONOSCOPY N/A 12/09/2023   Procedure: COLONOSCOPY;  Surgeon: Jinny Carmine, MD;  Location: [facility] ENDOSCOPY;  Service: Endoscopy;  Laterality: N/A;   ESOPHAGOGASTRODUODENOSCOPY  11/2020   ESOPHAGOGASTRODUODENOSCOPY N/A 12/09/2023   Procedure: EGD (ESOPHAGOGASTRODUODENOSCOPY);  Surgeon: Jinny Carmine, MD;  Location: [facility] ENDOSCOPY;  Service: Endoscopy;  Laterality: N/A;   REFRACTIVE SURGERY Bilateral 2015   Patient Active Problem List   Diagnosis Date Noted   Ocular migraine 01/24/2024   Tick bite of scalp 01/24/2024   Family history of colon cancer in father 12/09/2023   Barrett's esophagus with dysplasia 12/09/2023   Polyp of esophagus 12/09/2023   Obesity with serious comorbidity 08/30/2023   Healthcare maintenance 05/23/2023   Depression with anxiety 05/23/2023   Mixed hyperlipidemia 04/02/2023   Encounter for weight management 10/18/2022   Carpal tunnel syndrome on left 06/12/2022   Low serum vitamin D  05/21/2022   Cervical paraspinal muscle spasm  06/07/2021   Annual physical exam 05/09/2021   Perimenopausal symptoms 05/02/2021   Primary osteoarthritis of hips, bilateral 03/20/2021   PCP: Alvia Selinda PARAS, MD  REFERRING PROVIDER: Alvia Selinda PARAS, MD  REFERRING DIAG: M16.0 (ICD-10-CM) - Primary osteoarthritis of hips, bilateral   THERAPY DIAG:  Pain in right hip  Muscle weakness (generalized)  Decreased range of motion of both hips  Bilateral hip joint arthritis  Rationale for Evaluation and Treatment: Rehabilitation  ONSET DATE: 10 year history of bilateral hip pain  SUBJECTIVE:   SUBJECTIVE STATEMENT: Pt presents to physical therapy with chronic, 10 year history of bilateral hip pain (right significantly > than left at time of eval). Pt reports that pain can increase to a 9/10 at its worst with inactivity, prolonged standing/walking/hiking/climbing stairs. Pt has found relief in symptoms with walking it off after a few minutes or with occasional use of heat. Pt has previously used Tylenol and muscle relaxers to address pain which was mostly unbeneficial for her. Pt denies any distal symptoms or sensations of numbness, tingling, or burning sensations.    PERTINENT HISTORY: Pt currently attends gym 5x a week where she does treadmill walking and general strengthening. Pt would like to increased her LE strength in order to decrease hip pain which she has not had much success with on her own up to this point.   PAIN:  Are you having pain? Yes: NPRS scale: 0/10 current, 9/10 at its worst Pain location: lateral aspect of  right hip Pain description: deep, achy Aggravating factors: Inactivity (especially sleeping), laying on right side, prolonged standing/walking/hiking/stair climbing Relieving factors: Heat, walking it off after 5 minutes  PRECAUTIONS: None  RED FLAGS: None   WEIGHT BEARING RESTRICTIONS: No  FALLS:  Has patient fallen in last 6 months? No  LIVING ENVIRONMENT: Lives with: lives with their  family Lives in: House/apartment Stairs: Yes: Internal: 8 steps; can reach both Has following equipment at home: None  OCCUPATION: Desk job, works from home   PLOF: Independent  PATIENT GOALS: To build up lower extremity strength to decrease hip pain and become independent with gym program to maintain gains made from therapy.   NEXT MD VISIT: PRN  OBJECTIVE:  Note: Objective measures were completed at Evaluation unless otherwise noted.  PATIENT SURVEYS:  LEFs: 56/80  COGNITION: Overall cognitive status: Within functional limits for tasks assessed   SENSATION: Light touch: WFL  MUSCLE LENGTH: Hamstrings: no significant restrictions noted bilaterally   POSTURE: rounded shoulders and forward head  PALPATION: No tenderness or pain noted with palpation to right hip region  LOWER EXTREMITY ROM:  Active ROM Right eval Left eval  Hip flexion 90 deg 95 deg  Hip extension    Hip abduction 70 deg 60 deg  Hip adduction    Hip internal rotation (seated) 18 deg 16 deg  Hip external rotation 40 deg 38 deg  Knee flexion    Knee extension    Ankle dorsiflexion    Ankle plantarflexion    Ankle inversion    Ankle eversion     (Blank rows = not tested)  LOWER EXTREMITY MMT:  MMT Right eval Left eval  Hip flexion 4 4  Hip extension 4- 4-  Hip abduction 4- 4-  Hip adduction 4+ 5  Hip internal rotation    Hip external rotation    Knee flexion 5 5  Knee extension 4+ 4+  Ankle dorsiflexion 4+ 4+  Ankle plantarflexion    Ankle inversion    Ankle eversion     (Blank rows = not tested)  JOINT MOBILITY Short and long axis distraction assessment Right hip: hypomobile, pain decreased  Left hip: hypomobile, pain decreased  LOWER EXTREMITY SPECIAL TESTS:  FADDIR: positive bilaterally SLR Test: negative for provocation of familiar symptoms  FUNCTIONAL TESTS:  5xSTS: 12.54 seconds  GAIT: Distance walked: 65 feet Assistive device utilized: None Level of assistance:  Complete Independence Comments: Pt ambulates with reciprocal gait pattern and decreased arm swing in RUE noted. Pt observed to ambulate with hips positioned slightly in externally rotated position with otherwise unremarkable compensations or significant abnormalities noted.   STAIRS: Pt ascended and descended steps with reciprocal gait pattern and without the use of HRs for UE support. Pt observed to negotiate stairs with hips positioned slightly in externally rotated position with otherwise unremarkable compensations or significant abnormalities noted.  5xSTS: 8.75 seconds  TREATMENT DATE: 04/01/2024   Subjective:  Pt presents to physical therapy without any significant pain in bilateral hips at start of tx session. Pt states that her hips have bothered her on occasion, particularly in the morning, but the pain dissipates after she gets up and moving.   Therapeutic Activity:  Prone hip extensions, 3#, 2 x 8 bilaterally Sidelying hip abduction in modified plank position, 3#, 2 x 12 TG single leg Squats, 30#, 2 x 8 bilaterally  Walking forward marches in // bars with 5# AWs, 3 x down and back BOSU ball squats (blue side up), 2 x 10 Lateral resisted walking on N[place]ilus, 125#, 1 x 4 bilaterally  Manual tx.: Supine LE generalized stretches/PROM of bilateral hips (focus on hip IR/ER)- limited R hip IR as compared to L.  Moderate B hip hypomobility noted.   Supine LAD to R LE to increase hip mobility/ decrease pain. Supine R hip grade III-IV mobs. (Inferior/ lateral)- MWM to promote increase L hip flexion/ IR.  3x30 sec. Each (no increase c/o pain)  Contract-relax technique of left hip to increase IR in supine position   Not performed today:  Supine: knee to chest (focus on hip flexion to 90 deg. (Focus on maintaining hip in midline) Standing hip 3-way 20x each (no ankle wts.) TRX  Squats, 2 x 10 Supine single leg Bridges, 2 x 10 bilaterally Walking lunges in // bars with 7# Dbs, 4 x down and back Lateral Step ups to 12-inch stair, 7# DB, 2 x 20 bilaterally  Supine SL Bridges, 2 x 8  Lateral walking in // bars with blue TB above ankles, 3 x down and back  PATIENT EDUCATION:  Education details: Pt educated on anatomy involved, diagnosis, prognosis, POC, HEP, and current gym program/other recommendations for activity to assist with current right hip pain Person educated: Patient Education method: Explanation, Demonstration, Tactile cues, Verbal cues, and Handouts Education comprehension: verbalized understanding and returned demonstration  HOME EXERCISE PROGRAM: Access Code: [email or web address] URL: [email or web address] Date: 02/25/2024 Prepared by: Ozell Sero Exercises - Standing Hip Abduction with Counter Support - 1 x daily - 3 x weekly - 2 sets - 15 reps - Standing Hip Extension with Counter Support - 1 x daily - 3 x weekly - 2 sets - 15 reps - Seated March - 1 x daily - 3 x weekly - 2 sets - 15 reps   Access Code: [email or web address] URL: [email or web address] Date: 03/05/2024 Prepared by: Ozell Sero Exercises - Side Stepping with Resistance at Ankles - 1 x daily - 5 x weekly - 2 sets - 12 reps - Standing 3-Way Leg Reach with Resistance at Ankles and Counter Support - 1 x daily - 5 x weekly - 2 sets - 12 reps - Bridge - 1 x daily - 5 x weekly - 2 sets - 12 rep   ASSESSMENT:  CLINICAL IMPRESSION: Patient presents without significant bilateral hip pain on this day. Continued with manual therapy interventions to address hypomobility and limitations with ROM and R hip continues to be more limited than L, particularly with flexion and IR. Pt was progressed to completing sidelying hip abduction and lateral resisted walking on N[place]ilus with increased resistance on this day which she tolerated well. Pt was introduced to new activity on this day which was BOSU ball  squats (blue side up) which she was able to complete without significant difficulty or pain. Pt responded well to all manual therapy and therapeutic exercise interventions without reporting an increase in pain at  end of tx session. Will continue to monitor and progress as able.   OBJECTIVE IMPAIRMENTS: Abnormal gait, decreased ROM, decreased strength, hypomobility, and pain.   ACTIVITY LIMITATIONS: carrying, bending, standing, squatting, sleeping, stairs, locomotion level, and caring for others  PARTICIPATION LIMITATIONS: meal prep, cleaning, laundry, shopping, and community activity  PERSONAL FACTORS: Age are also affecting patient's functional outcome.   REHAB POTENTIAL: Good  CLINICAL DECISION MAKING: Stable/uncomplicated  EVALUATION COMPLEXITY: Low   GOALS: Goals reviewed with patient? Yes  SHORT TERM GOALS: Target date: 03/17/2024 Pt to experience no greater than 5/10 pain at its worst so that she can sleep at night with greater ease.  Baseline:9/10 at worst; (10/27): 4/10 at worst Goal status: GOAL MET  2.  Pt will perform 5xSTS in < 10 seconds to allow for improvement with functional transfers with greater ease.  Baseline: 12.54 seconds; (10/27): 8.75 seconds  Goal status: GOAL MET  LONG TERM GOALS: Target date: 04/07/2024  Pt to increase bilateral hip extension and abduction to at least a 4+/5 to improve ability with prolonged walking.  Baseline: see above Goal status: INITIAL  2.  Pt to increase LEFs score to at least 66 out of 80 to improve self perceived ability to complete daily household tasks without limitations.  Baseline: 56 out of 80 Goal status: INITIAL  3.  Pt to increase right hip flexion AROM to at least 95 degrees to become symmetrical bilaterally and allow for greater ease with climbing stairs.  Baseline: 90 degrees Goal status: INITIAL  PLAN:  PT FREQUENCY: 1-2x/week  PT DURATION: 6 weeks  PLANNED INTERVENTIONS: 97164- PT Re-evaluation,  97110-Therapeutic exercises, 97530- Therapeutic activity, [id number]- Neuromuscular re-education, 97535- Self Care, [id number]- Manual therapy, Patient/Family education, Joint mobilization, Cryotherapy, and Moist heat  PLAN FOR NEXT SESSION: Review response to HEP and current gym program and make adjustments as needed.  CHECK GOALS  Curtistine Bracket, SPT  Ozell JAYSON Sero, PT, DPT # [phone] 04/01/2024, 8:56 AM

## 2024-04-01 ENCOUNTER — Encounter: Payer: Self-pay | Admitting: Physical Therapy

## 2024-04-02 ENCOUNTER — Ambulatory Visit: Admitting: Physical Therapy

## 2024-04-02 DIAGNOSIS — M6281 Muscle weakness (generalized): Secondary | ICD-10-CM | POA: Diagnosis not present

## 2024-04-02 DIAGNOSIS — M25551 Pain in right hip: Secondary | ICD-10-CM

## 2024-04-02 DIAGNOSIS — M25652 Stiffness of left hip, not elsewhere classified: Secondary | ICD-10-CM | POA: Diagnosis not present

## 2024-04-02 DIAGNOSIS — M160 Bilateral primary osteoarthritis of hip: Secondary | ICD-10-CM | POA: Diagnosis not present

## 2024-04-02 DIAGNOSIS — M25651 Stiffness of right hip, not elsewhere classified: Secondary | ICD-10-CM | POA: Diagnosis not present

## 2024-04-02 NOTE — Therapy (Unsigned)
 OUTPATIENT PHYSICAL THERAPY LOWER EXTREMITY TREATMENT Physical Therapy Progress Note  Dates of reporting period  02/25/24   to   04/02/24   Patient Name: Alexandra Salinas MRN: [id number] DOB:05/02/1976, 48 y.o., female Today's Date: 04/02/2024  END OF SESSION:  PT End of Session - 04/02/24 1141     Visit Number 10    Number of Visits 12    Date for Recertification  04/07/24    PT Start Time 1115    PT Stop Time 1200    PT Time Calculation (min) 45 min    Activity Tolerance Patient limited by fatigue;Patient tolerated treatment well;No increased pain    Behavior During Therapy [facility] for tasks assessed/performed          Past Medical History:  Diagnosis Date   Anxiety    Basal cell carcinoma 2023   right forehead, Mohs   Depression    Diverticulitis    GERD (gastroesophageal reflux disease)    H/O basal cell carcinoma excision 05/23/2023   Past Surgical History:  Procedure Laterality Date   BASAL CELL CARCINOMA EXCISION  10/2021   forehead   CESAREAN SECTION  2021   COLONOSCOPY  2020   COLONOSCOPY N/A 12/09/2023   Procedure: COLONOSCOPY;  Surgeon: Jinny Carmine, MD;  Location: [facility] ENDOSCOPY;  Service: Endoscopy;  Laterality: N/A;   ESOPHAGOGASTRODUODENOSCOPY  11/2020   ESOPHAGOGASTRODUODENOSCOPY N/A 12/09/2023   Procedure: EGD (ESOPHAGOGASTRODUODENOSCOPY);  Surgeon: Jinny Carmine, MD;  Location: [facility] ENDOSCOPY;  Service: Endoscopy;  Laterality: N/A;   REFRACTIVE SURGERY Bilateral 2015   Patient Active Problem List   Diagnosis Date Noted   Ocular migraine 01/24/2024   Tick bite of scalp 01/24/2024   Family history of colon cancer in father 12/09/2023   Barrett's esophagus with dysplasia 12/09/2023   Polyp of esophagus 12/09/2023   Obesity with serious comorbidity 08/30/2023   Healthcare maintenance 05/23/2023   Depression with anxiety 05/23/2023   Mixed hyperlipidemia 04/02/2023   Encounter for weight management 10/18/2022   Carpal tunnel syndrome on left  06/12/2022   Low serum vitamin D  05/21/2022   Cervical paraspinal muscle spasm 06/07/2021   Annual physical exam 05/09/2021   Perimenopausal symptoms 05/02/2021   Primary osteoarthritis of hips, bilateral 03/20/2021   PCP: Alvia Selinda PARAS, MD  REFERRING PROVIDER: Alvia Selinda PARAS, MD  REFERRING DIAG: M16.0 (ICD-10-CM) - Primary osteoarthritis of hips, bilateral   THERAPY DIAG:  Pain in right hip  Muscle weakness (generalized)  Decreased range of motion of both hips  Bilateral hip joint arthritis  Rationale for Evaluation and Treatment: Rehabilitation  ONSET DATE: 10 year history of bilateral hip pain  SUBJECTIVE:   SUBJECTIVE STATEMENT: Pt presents to physical therapy with chronic, 10 year history of bilateral hip pain (right significantly > than left at time of eval). Pt reports that pain can increase to a 9/10 at its worst with inactivity, prolonged standing/walking/hiking/climbing stairs. Pt has found relief in symptoms with walking it off after a few minutes or with occasional use of heat. Pt has previously used Tylenol and muscle relaxers to address pain which was mostly unbeneficial for her. Pt denies any distal symptoms or sensations of numbness, tingling, or burning sensations.    PERTINENT HISTORY: Pt currently attends gym 5x a week where she does treadmill walking and general strengthening. Pt would like to increased her LE strength in order to decrease hip pain which she has not had much success with on her own up to this point.   PAIN:  Are  you having pain? Yes: NPRS scale: 0/10 current, 9/10 at its worst Pain location: lateral aspect of right hip Pain description: deep, achy Aggravating factors: Inactivity (especially sleeping), laying on right side, prolonged standing/walking/hiking/stair climbing Relieving factors: Heat, walking it off after 5 minutes  PRECAUTIONS: None  RED FLAGS: None   WEIGHT BEARING RESTRICTIONS: No  FALLS:  Has patient fallen  in last 6 months? No  LIVING ENVIRONMENT: Lives with: lives with their family Lives in: House/apartment Stairs: Yes: Internal: 8 steps; can reach both Has following equipment at home: None  OCCUPATION: Desk job, works from home   PLOF: Independent  PATIENT GOALS: To build up lower extremity strength to decrease hip pain and become independent with gym program to maintain gains made from therapy.   NEXT MD VISIT: PRN  OBJECTIVE:  Note: Objective measures were completed at Evaluation unless otherwise noted.  PATIENT SURVEYS:  LEFs: 56/80  COGNITION: Overall cognitive status: Within functional limits for tasks assessed   SENSATION: Light touch: WFL  MUSCLE LENGTH: Hamstrings: no significant restrictions noted bilaterally   POSTURE: rounded shoulders and forward head  PALPATION: No tenderness or pain noted with palpation to right hip region  LOWER EXTREMITY ROM:  Active ROM Right eval Left eval  Hip flexion 90 deg 95 deg  Hip extension    Hip abduction 70 deg 60 deg  Hip adduction    Hip internal rotation (seated) 18 deg 16 deg  Hip external rotation 40 deg 38 deg  Knee flexion    Knee extension    Ankle dorsiflexion    Ankle plantarflexion    Ankle inversion    Ankle eversion     (Blank rows = not tested)  LOWER EXTREMITY MMT:  MMT Right eval Left eval  Hip flexion 4 4  Hip extension 4- 4-  Hip abduction 4- 4-  Hip adduction 4+ 5  Hip internal rotation    Hip external rotation    Knee flexion 5 5  Knee extension 4+ 4+  Ankle dorsiflexion 4+ 4+  Ankle plantarflexion    Ankle inversion    Ankle eversion     (Blank rows = not tested)  JOINT MOBILITY Short and long axis distraction assessment Right hip: hypomobile, pain decreased  Left hip: hypomobile, pain decreased  LOWER EXTREMITY SPECIAL TESTS:  FADDIR: positive bilaterally SLR Test: negative for provocation of familiar symptoms  FUNCTIONAL TESTS:  5xSTS: 12.54 seconds  GAIT: Distance  walked: 65 feet Assistive device utilized: None Level of assistance: Complete Independence Comments: Pt ambulates with reciprocal gait pattern and decreased arm swing in RUE noted. Pt observed to ambulate with hips positioned slightly in externally rotated position with otherwise unremarkable compensations or significant abnormalities noted.   STAIRS: Pt ascended and descended steps with reciprocal gait pattern and without the use of HRs for UE support. Pt observed to negotiate stairs with hips positioned slightly in externally rotated position with otherwise unremarkable compensations or significant abnormalities noted.  5xSTS: 8.75 seconds  TREATMENT DATE: 04/02/2024   Subjective:  Pt presents to physical therapy without any significant pain in bilateral hips at start of tx session.  Therapeutic Activity:  Prone hip extensions, 3#, 2 x 8 bilaterally Sidelying hip abduction in modified plank position, 3#, 2 x 12 TG single leg Squats, 30#, 2 x 8 bilaterally  Lateral resisted walking on Nautilus, 125#, 1 x 4 bilaterally Single Leg RDL, 1 x 10 bilaterally with 9# DB, 1 x 10 bilaterally with 30# DB  Manual tx.: Supine LE generalized stretches/PROM of bilateral hips (focus on hip IR/ER)- limited R hip IR as compared to L.  Moderate B hip hypomobility noted.   Supine LAD to R LE to increase hip mobility/ decrease pain. Supine R hip grade III-IV mobs. (Inferior/ lateral)- MWM to promote increase L hip flexion/ IR.  3x30 sec. Each (no increase c/o pain)  Contract-relax technique of left hip to increase IR in supine position   04/02/2024 LEFs: 69 out of 80 Right hip flexion: degrees MMT Strength measurements                      Right             Left Hip extension 4+ 4+  Hip abduction (sidelying) 5 5    Not performed today:  Supine: knee to chest (focus on hip flexion to 90  deg. (Focus on maintaining hip in midline) Standing hip 3-way 20x each (no ankle wts.) TRX Squats, 2 x 10 Supine single leg Bridges, 2 x 10 bilaterally Walking lunges in // bars with 7# Dbs, 4 x down and back Lateral Step ups to 12-inch stair, 7# DB, 2 x 20 bilaterally  Supine SL Bridges, 2 x 8  Lateral walking in // bars with blue TB above ankles, 3 x down and back Walking forward marches in // bars with 5# AWs, 3 x down and back  PATIENT EDUCATION:  Education details: Pt educated on anatomy involved, diagnosis, prognosis, POC, HEP, and current gym program/other recommendations for activity to assist with current right hip pain Person educated: Patient Education method: Explanation, Demonstration, Tactile cues, Verbal cues, and Handouts Education comprehension: verbalized understanding and returned demonstration  HOME EXERCISE PROGRAM: Access Code: [email or web address] URL: [email or web address] Date: 02/25/2024 Prepared by: Ozell Sero Exercises - Standing Hip Abduction with Counter Support - 1 x daily - 3 x weekly - 2 sets - 15 reps - Standing Hip Extension with Counter Support - 1 x daily - 3 x weekly - 2 sets - 15 reps - Seated March - 1 x daily - 3 x weekly - 2 sets - 15 reps   Access Code: [email or web address] URL: [email or web address] Date: 03/05/2024 Prepared by: Ozell Sero Exercises - Side Stepping with Resistance at Ankles - 1 x daily - 5 x weekly - 2 sets - 12 reps - Standing 3-Way Leg Reach with Resistance at Ankles and Counter Support - 1 x daily - 5 x weekly - 2 sets - 12 reps - Bridge - 1 x daily - 5 x weekly - 2 sets - 12 rep   ASSESSMENT:  CLINICAL IMPRESSION: Patient presents without significant bilateral hip pain on this day. Continued with manual therapy interventions to address hypomobility and limitations with ROM and R hip continues to be more limited than L, particularly with flexion and IR. Updated LEFs, hip strength MMTs, and hip flexion AROM (see above)  on this day which have all seen improvements. Pt introduced to SL RDLs on this  day with dumbbell resistance which she completed without significant difficulty or increased pain. Pt responded well to all manual therapy and therapeutic exercise interventions without reporting an increase in pain at end of tx session. Will continue to monitor and progress as able.   Pt has attended and actively participated in 10 physical therapy sessions to date. Pt has experienced improvements with decreased bilateral hip pain, ease with functional transfers, gross LE strength, and self perceived ability to complete daily household tasks with greater ease. Pt with remaining deficits in bilateral hip mobility which affects her gait and ability to traverse stairs. Pt will continue to benefit from skilled physical therapy intervention for remaining POC to address remaining limitations and maximize functional outcomes by achieving her remaining goals.   OBJECTIVE IMPAIRMENTS: Abnormal gait, decreased ROM, decreased strength, hypomobility, and pain.   ACTIVITY LIMITATIONS: carrying, bending, standing, squatting, sleeping, stairs, locomotion level, and caring for others  PARTICIPATION LIMITATIONS: meal prep, cleaning, laundry, shopping, and community activity  PERSONAL FACTORS: Age are also affecting patient's functional outcome.   REHAB POTENTIAL: Good  CLINICAL DECISION MAKING: Stable/uncomplicated  EVALUATION COMPLEXITY: Low   GOALS: Goals reviewed with patient? Yes  SHORT TERM GOALS: Target date: 03/17/2024 Pt to experience no greater than 5/10 pain at its worst so that she can sleep at night with greater ease.  Baseline:9/10 at worst; (10/27): 4/10 at worst Goal status: GOAL MET  2.  Pt will perform 5xSTS in < 10 seconds to allow for improvement with functional transfers with greater ease.  Baseline: 12.54 seconds; (10/27): 8.75 seconds  Goal status: GOAL MET  LONG TERM GOALS: Target date:  04/07/2024  Pt to increase bilateral hip extension and abduction to at least a 4+/5 to improve ability with prolonged walking.  Baseline: see above, (11/13): 4+/5 bilaterally extension, 5/5 bilaterally abduction Goal status: GOAL MET  2.  Pt to increase LEFs score to at least 66 out of 80 to improve self perceived ability to complete daily household tasks without limitations.  Baseline: 56 out of 80, (11/13): 69 out of 80 Goal status: GOAL MET  3.  Pt to increase right hip flexion AROM to at least 95 degrees to become symmetrical bilaterally and allow for greater ease with climbing stairs.  Baseline: 90 degrees. Goal status: On-going  PLAN:  PT FREQUENCY: 1-2x/week  PT DURATION: 6 weeks  PLANNED INTERVENTIONS: [id number]- PT Re-evaluation, 97110-Therapeutic exercises, 97530- Therapeutic activity, [id number]- Neuromuscular re-education, 97535- Self Care, [id number]- Manual therapy, Patient/Family education, Joint mobilization, Cryotherapy, and Moist heat  PLAN FOR NEXT SESSION: Review response to HEP and current gym program and make adjustments as needed. Plan for final discharge on 11/20.   Curtistine Bracket, SPT  Ozell JAYSON Sero, PT, DPT # [phone] 04/02/2024, 11:43 AM

## 2024-04-07 ENCOUNTER — Ambulatory Visit: Admitting: Physical Therapy

## 2024-04-07 DIAGNOSIS — M25551 Pain in right hip: Secondary | ICD-10-CM | POA: Diagnosis not present

## 2024-04-07 DIAGNOSIS — M25651 Stiffness of right hip, not elsewhere classified: Secondary | ICD-10-CM | POA: Diagnosis not present

## 2024-04-07 DIAGNOSIS — M25652 Stiffness of left hip, not elsewhere classified: Secondary | ICD-10-CM

## 2024-04-07 DIAGNOSIS — M6281 Muscle weakness (generalized): Secondary | ICD-10-CM

## 2024-04-07 DIAGNOSIS — M160 Bilateral primary osteoarthritis of hip: Secondary | ICD-10-CM | POA: Diagnosis not present

## 2024-04-07 NOTE — Therapy (Unsigned)
 OUTPATIENT PHYSICAL THERAPY LOWER EXTREMITY TREATMENT  Patient Name: Alexandra Salinas MRN: [id number] DOB:01/21/1976, 48 y.o., female Today's Date: 04/08/2024  END OF SESSION:  PT End of Session - 04/07/24 1135     Visit Number 11    Number of Visits 12    Date for Recertification  04/07/24    PT Start Time 1115    PT Stop Time 1201    PT Time Calculation (min) 46 min    Activity Tolerance Patient limited by fatigue;Patient tolerated treatment well;No increased pain    Behavior During Therapy [facility] for tasks assessed/performed           Past Medical History:  Diagnosis Date   Anxiety    Basal cell carcinoma 2023   right forehead, Mohs   Depression    Diverticulitis    GERD (gastroesophageal reflux disease)    H/O basal cell carcinoma excision 05/23/2023   Past Surgical History:  Procedure Laterality Date   BASAL CELL CARCINOMA EXCISION  10/2021   forehead   CESAREAN SECTION  2021   COLONOSCOPY  2020   COLONOSCOPY N/A 12/09/2023   Procedure: COLONOSCOPY;  Surgeon: Jinny Carmine, MD;  Location: [facility] ENDOSCOPY;  Service: Endoscopy;  Laterality: N/A;   ESOPHAGOGASTRODUODENOSCOPY  11/2020   ESOPHAGOGASTRODUODENOSCOPY N/A 12/09/2023   Procedure: EGD (ESOPHAGOGASTRODUODENOSCOPY);  Surgeon: Jinny Carmine, MD;  Location: [facility] ENDOSCOPY;  Service: Endoscopy;  Laterality: N/A;   REFRACTIVE SURGERY Bilateral 2015   Patient Active Problem List   Diagnosis Date Noted   Ocular migraine 01/24/2024   Tick bite of scalp 01/24/2024   Family history of colon cancer in father 12/09/2023   Barrett's esophagus with dysplasia 12/09/2023   Polyp of esophagus 12/09/2023   Obesity with serious comorbidity 08/30/2023   Healthcare maintenance 05/23/2023   Depression with anxiety 05/23/2023   Mixed hyperlipidemia 04/02/2023   Encounter for weight management 10/18/2022   Carpal tunnel syndrome on left 06/12/2022   Low serum vitamin D  05/21/2022   Cervical paraspinal muscle spasm  06/07/2021   Annual physical exam 05/09/2021   Perimenopausal symptoms 05/02/2021   Primary osteoarthritis of hips, bilateral 03/20/2021   PCP: Alvia Selinda PARAS, MD  REFERRING PROVIDER: Alvia Selinda PARAS, MD  REFERRING DIAG: M16.0 (ICD-10-CM) - Primary osteoarthritis of hips, bilateral   THERAPY DIAG:  Pain in right hip  Muscle weakness (generalized)  Decreased range of motion of both hips  Bilateral hip joint arthritis  Rationale for Evaluation and Treatment: Rehabilitation  ONSET DATE: 10 year history of bilateral hip pain  SUBJECTIVE:   SUBJECTIVE STATEMENT: Pt presents to physical therapy with chronic, 10 year history of bilateral hip pain (right significantly > than left at time of eval). Pt reports that pain can increase to a 9/10 at its worst with inactivity, prolonged standing/walking/hiking/climbing stairs. Pt has found relief in symptoms with walking it off after a few minutes or with occasional use of heat. Pt has previously used Tylenol and muscle relaxers to address pain which was mostly unbeneficial for her. Pt denies any distal symptoms or sensations of numbness, tingling, or burning sensations.    PERTINENT HISTORY: Pt currently attends gym 5x a week where she does treadmill walking and general strengthening. Pt would like to increased her LE strength in order to decrease hip pain which she has not had much success with on her own up to this point.   PAIN:  Are you having pain? Yes: NPRS scale: 0/10 current, 9/10 at its worst Pain location: lateral aspect of  right hip Pain description: deep, achy Aggravating factors: Inactivity (especially sleeping), laying on right side, prolonged standing/walking/hiking/stair climbing Relieving factors: Heat, walking it off after 5 minutes  PRECAUTIONS: None  RED FLAGS: None   WEIGHT BEARING RESTRICTIONS: No  FALLS:  Has patient fallen in last 6 months? No  LIVING ENVIRONMENT: Lives with: lives with their  family Lives in: House/apartment Stairs: Yes: Internal: 8 steps; can reach both Has following equipment at home: None  OCCUPATION: Desk job, works from home   PLOF: Independent  PATIENT GOALS: To build up lower extremity strength to decrease hip pain and become independent with gym program to maintain gains made from therapy.   NEXT MD VISIT: PRN  OBJECTIVE:  Note: Objective measures were completed at Evaluation unless otherwise noted.  PATIENT SURVEYS:  LEFs: 56/80  COGNITION: Overall cognitive status: Within functional limits for tasks assessed   SENSATION: Light touch: WFL  MUSCLE LENGTH: Hamstrings: no significant restrictions noted bilaterally   POSTURE: rounded shoulders and forward head  PALPATION: No tenderness or pain noted with palpation to right hip region  LOWER EXTREMITY ROM:  Active ROM Right eval Left eval  Hip flexion 90 deg 95 deg  Hip extension    Hip abduction 70 deg 60 deg  Hip adduction    Hip internal rotation (seated) 18 deg 16 deg  Hip external rotation 40 deg 38 deg  Knee flexion    Knee extension    Ankle dorsiflexion    Ankle plantarflexion    Ankle inversion    Ankle eversion     (Blank rows = not tested)  LOWER EXTREMITY MMT:  MMT Right eval Left eval  Hip flexion 4 4  Hip extension 4- 4-  Hip abduction 4- 4-  Hip adduction 4+ 5  Hip internal rotation    Hip external rotation    Knee flexion 5 5  Knee extension 4+ 4+  Ankle dorsiflexion 4+ 4+  Ankle plantarflexion    Ankle inversion    Ankle eversion     (Blank rows = not tested)  JOINT MOBILITY Short and long axis distraction assessment Right hip: hypomobile, pain decreased  Left hip: hypomobile, pain decreased  LOWER EXTREMITY SPECIAL TESTS:  FADDIR: positive bilaterally SLR Test: negative for provocation of familiar symptoms  FUNCTIONAL TESTS:  5xSTS: 12.54 seconds  GAIT: Distance walked: 65 feet Assistive device utilized: None Level of assistance:  Complete Independence Comments: Pt ambulates with reciprocal gait pattern and decreased arm swing in RUE noted. Pt observed to ambulate with hips positioned slightly in externally rotated position with otherwise unremarkable compensations or significant abnormalities noted.   STAIRS: Pt ascended and descended steps with reciprocal gait pattern and without the use of HRs for UE support. Pt observed to negotiate stairs with hips positioned slightly in externally rotated position with otherwise unremarkable compensations or significant abnormalities noted.  5xSTS: 8.75 seconds         04/02/2024 LEFs: 69 out of 80 Right hip flexion: degrees MMT Strength measurements                      Right             Left Hip extension 4+ 4+  Hip abduction (sidelying) 5 5  TREATMENT DATE: 04/08/2024   Subjective:  Pt presents to physical therapy without any significant pain in bilateral hips at start of tx session.  Therapeutic Activity:  Prone hip extensions, 3#, 2 x 8 bilaterally  Sidelying hip abduction in modified plank position, 3#, 2 x 12 TG single leg Squats, 30#, 2 x 8 bilaterally  Lateral resisted walking on Nautilus, 140#, 1 x 4 bilaterally Single Leg RDL, 2 x 10 bilaterally with 30# DB Forward and lateral step-ups to 12 stair, (eccentric control focus upon descent), 1 x 20 each   Manual tx.: Supine LE generalized stretches/PROM of bilateral hips (focus on hip IR/ER)- limited R hip IR as compared to L.  Moderate B hip hypomobility noted.   Supine LAD to R LE to increase hip mobility/ decrease pain. Supine R hip grade III-IV mobs. (Inferior/ lateral)- MWM to promote increase L hip flexion/ IR.  3x30 sec. Each (no increase c/o pain)  Contract-relax technique of left hip to increase IR in supine position    Not performed today:  Supine: knee to chest (focus on hip flexion to 90  deg. (Focus on maintaining hip in midline) Standing hip 3-way 20x each (no ankle wts.) TRX Squats, 2 x 10 Supine single leg Bridges, 2 x 10 bilaterally Walking lunges in // bars with 7# Dbs, 4 x down and back Lateral Step ups to 12-inch stair, 7# DB, 2 x 20 bilaterally  Supine SL Bridges, 2 x 8  Lateral walking in // bars with blue TB above ankles, 3 x down and back Walking forward marches in // bars with 5# AWs, 3 x down and back  PATIENT EDUCATION:  Education details: Pt educated on anatomy involved, diagnosis, prognosis, POC, HEP, and current gym program/other recommendations for activity to assist with current right hip pain Person educated: Patient Education method: Explanation, Demonstration, Tactile cues, Verbal cues, and Handouts Education comprehension: verbalized understanding and returned demonstration  HOME EXERCISE PROGRAM: Access Code: [email or web address] URL: [email or web address] Date: 02/25/2024 Prepared by: Ozell Sero Exercises - Standing Hip Abduction with Counter Support - 1 x daily - 3 x weekly - 2 sets - 15 reps - Standing Hip Extension with Counter Support - 1 x daily - 3 x weekly - 2 sets - 15 reps - Seated March - 1 x daily - 3 x weekly - 2 sets - 15 reps   Access Code: [email or web address] URL: [email or web address] Date: 03/05/2024 Prepared by: Ozell Sero Exercises - Side Stepping with Resistance at Ankles - 1 x daily - 5 x weekly - 2 sets - 12 reps - Standing 3-Way Leg Reach with Resistance at Ankles and Counter Support - 1 x daily - 5 x weekly - 2 sets - 12 reps - Bridge - 1 x daily - 5 x weekly - 2 sets - 12 rep   ASSESSMENT:  CLINICAL IMPRESSION: Patient presents without significant bilateral hip pain on this day. Continued with manual therapy interventions to address hypomobility and limitations with ROM. Pt introduced to forward and lateral step ups/downs to 12-inch stair on this day which she completed without significant difficulty or increased  pain. Pt was progressed to completing lateral resisted walking on Nautilus for increased resistance and reps on this day which she tolerated well. Pt responded well to all manual therapy and therapeutic exercise interventions without reporting an increase in pain at end of tx session. Plan for final discharge after next tx session.   OBJECTIVE IMPAIRMENTS: Abnormal gait, decreased ROM, decreased strength, hypomobility, and pain.  ACTIVITY LIMITATIONS: carrying, bending, standing, squatting, sleeping, stairs, locomotion level, and caring for others  PARTICIPATION LIMITATIONS: meal prep, cleaning, laundry, shopping, and community activity  PERSONAL FACTORS: Age are also affecting patient's functional outcome.   REHAB POTENTIAL: Good  CLINICAL DECISION MAKING: Stable/uncomplicated  EVALUATION COMPLEXITY: Low  GOALS: Goals reviewed with patient? Yes  SHORT TERM GOALS: Target date: 03/17/2024 Pt to experience no greater than 5/10 pain at its worst so that she can sleep at night with greater ease.  Baseline:9/10 at worst; (10/27): 4/10 at worst Goal status: GOAL MET  2.  Pt will perform 5xSTS in < 10 seconds to allow for improvement with functional transfers with greater ease.  Baseline: 12.54 seconds; (10/27): 8.75 seconds  Goal status: GOAL MET  LONG TERM GOALS: Target date: 04/07/2024  Pt to increase bilateral hip extension and abduction to at least a 4+/5 to improve ability with prolonged walking.  Baseline: see above, (11/13): 4+/5 bilaterally extension, 5/5 bilaterally abduction Goal status: GOAL MET  2.  Pt to increase LEFs score to at least 66 out of 80 to improve self perceived ability to complete daily household tasks without limitations.  Baseline: 56 out of 80, (11/13): 69 out of 80 Goal status: GOAL MET  3.  Pt to increase right hip flexion AROM to at least 95 degrees to become symmetrical bilaterally and allow for greater ease with climbing stairs.  Baseline: 90  degrees. Goal status: On-going  PLAN:  PT FREQUENCY: 1-2x/week  PT DURATION: 6 weeks  PLANNED INTERVENTIONS: 97164- PT Re-evaluation, 97110-Therapeutic exercises, 97530- Therapeutic activity, [id number]- Neuromuscular re-education, 97535- Self Care, [id number]- Manual therapy, Patient/Family education, Joint mobilization, Cryotherapy, and Moist heat  PLAN FOR NEXT SESSION: Pt to be discharged from physical therapy services on 11/20.   Curtistine Bracket, SPT  Ozell JAYSON Sero, PT, DPT # [phone] 04/08/2024, 8:12 AM

## 2024-04-08 ENCOUNTER — Encounter: Payer: Self-pay | Admitting: Physical Therapy

## 2024-04-09 ENCOUNTER — Encounter: Payer: Self-pay | Admitting: Family Medicine

## 2024-04-09 ENCOUNTER — Ambulatory Visit: Admitting: Physical Therapy

## 2024-04-09 DIAGNOSIS — M25551 Pain in right hip: Secondary | ICD-10-CM

## 2024-04-09 DIAGNOSIS — M6281 Muscle weakness (generalized): Secondary | ICD-10-CM | POA: Diagnosis not present

## 2024-04-09 DIAGNOSIS — M25651 Stiffness of right hip, not elsewhere classified: Secondary | ICD-10-CM

## 2024-04-09 DIAGNOSIS — M25652 Stiffness of left hip, not elsewhere classified: Secondary | ICD-10-CM | POA: Diagnosis not present

## 2024-04-09 DIAGNOSIS — M160 Bilateral primary osteoarthritis of hip: Secondary | ICD-10-CM

## 2024-04-09 NOTE — Telephone Encounter (Signed)
 Please complete Zepbound  PA.  KP

## 2024-04-09 NOTE — Therapy (Signed)
 OUTPATIENT PHYSICAL THERAPY LOWER EXTREMITY TREATMENT / DISCHARGE  Patient Name: Alexandra Salinas MRN: [id number] DOB:[birth year], 48 y.o., female Today's Date: 04/09/2024  END OF SESSION:  PT End of Session - 04/09/24 1201     Visit Number 12    Number of Visits 12    Date for Recertification  04/09/24    PT Start Time 1115    PT Stop Time 1200    PT Time Calculation (min) 45 min    Activity Tolerance Patient limited by fatigue;No increased pain;Patient tolerated treatment well    Behavior During Therapy [facility] for tasks assessed/performed            Past Medical History:  Diagnosis Date   Anxiety    Basal cell carcinoma 2023   right forehead, Mohs   Depression    Diverticulitis    GERD (gastroesophageal reflux disease)    H/O basal cell carcinoma excision 05/23/2023   Past Surgical History:  Procedure Laterality Date   BASAL CELL CARCINOMA EXCISION  10/2021   forehead   CESAREAN SECTION  2021   COLONOSCOPY  2020   COLONOSCOPY N/A 12/09/2023   Procedure: COLONOSCOPY;  Surgeon: Jinny Carmine, MD;  Location: [facility] ENDOSCOPY;  Service: Endoscopy;  Laterality: N/A;   ESOPHAGOGASTRODUODENOSCOPY  11/2020   ESOPHAGOGASTRODUODENOSCOPY N/A 12/09/2023   Procedure: EGD (ESOPHAGOGASTRODUODENOSCOPY);  Surgeon: Jinny Carmine, MD;  Location: [facility] ENDOSCOPY;  Service: Endoscopy;  Laterality: N/A;   REFRACTIVE SURGERY Bilateral 2015   Patient Active Problem List   Diagnosis Date Noted   Ocular migraine 01/24/2024   Tick bite of scalp 01/24/2024   Family history of colon cancer in father 12/09/2023   Barrett's esophagus with dysplasia 12/09/2023   Polyp of esophagus 12/09/2023   Obesity with serious comorbidity 08/30/2023   Healthcare maintenance 05/23/2023   Depression with anxiety 05/23/2023   Mixed hyperlipidemia 04/02/2023   Encounter for weight management 10/18/2022   Carpal tunnel syndrome on left 06/12/2022   Low serum vitamin D  05/21/2022   Cervical paraspinal muscle  spasm 06/07/2021   Annual physical exam 05/09/2021   Perimenopausal symptoms 05/02/2021   Primary osteoarthritis of hips, bilateral 03/20/2021   PCP: Alvia Selinda PARAS, MD  REFERRING PROVIDER: Alvia Selinda PARAS, MD  REFERRING DIAG: M16.0 (ICD-10-CM) - Primary osteoarthritis of hips, bilateral   THERAPY DIAG:  Pain in right hip  Muscle weakness (generalized)  Decreased range of motion of both hips  Bilateral hip joint arthritis  Rationale for Evaluation and Treatment: Rehabilitation  ONSET DATE: 10 year history of bilateral hip pain  SUBJECTIVE:   SUBJECTIVE STATEMENT: Pt presents to physical therapy with chronic, 10 year history of bilateral hip pain (right significantly > than left at time of eval). Pt reports that pain can increase to a 9/10 at its worst with inactivity, prolonged standing/walking/hiking/climbing stairs. Pt has found relief in symptoms with walking it off after a few minutes or with occasional use of heat. Pt has previously used Tylenol and muscle relaxers to address pain which was mostly unbeneficial for her. Pt denies any distal symptoms or sensations of numbness, tingling, or burning sensations.    PERTINENT HISTORY: Pt currently attends gym 5x a week where she does treadmill walking and general strengthening. Pt would like to increased her LE strength in order to decrease hip pain which she has not had much success with on her own up to this point.   PAIN:  Are you having pain? Yes: NPRS scale: 0/10 current, 9/10 at its worst Pain location:  lateral aspect of right hip Pain description: deep, achy Aggravating factors: Inactivity (especially sleeping), laying on right side, prolonged standing/walking/hiking/stair climbing Relieving factors: Heat, walking it off after 5 minutes  PRECAUTIONS: None  RED FLAGS: None   WEIGHT BEARING RESTRICTIONS: No  FALLS:  Has patient fallen in last 6 months? No  LIVING ENVIRONMENT: Lives with: lives with their  family Lives in: House/apartment Stairs: Yes: Internal: 8 steps; can reach both Has following equipment at home: None  OCCUPATION: Desk job, works from home   PLOF: Independent  PATIENT GOALS: To build up lower extremity strength to decrease hip pain and become independent with gym program to maintain gains made from therapy.   NEXT MD VISIT: PRN  OBJECTIVE:  Note: Objective measures were completed at Evaluation unless otherwise noted.  PATIENT SURVEYS:  LEFs: 56/80  COGNITION: Overall cognitive status: Within functional limits for tasks assessed   SENSATION: Light touch: WFL  MUSCLE LENGTH: Hamstrings: no significant restrictions noted bilaterally   POSTURE: rounded shoulders and forward head  PALPATION: No tenderness or pain noted with palpation to right hip region  LOWER EXTREMITY ROM:  Active ROM Right eval Left eval  Hip flexion 90 deg 95 deg  Hip extension    Hip abduction 70 deg 60 deg  Hip adduction    Hip internal rotation (seated) 18 deg 16 deg  Hip external rotation 40 deg 38 deg  Knee flexion    Knee extension    Ankle dorsiflexion    Ankle plantarflexion    Ankle inversion    Ankle eversion     (Blank rows = not tested)  LOWER EXTREMITY MMT:  MMT Right eval Left eval  Hip flexion 4 4  Hip extension 4- 4-  Hip abduction 4- 4-  Hip adduction 4+ 5  Hip internal rotation    Hip external rotation    Knee flexion 5 5  Knee extension 4+ 4+  Ankle dorsiflexion 4+ 4+  Ankle plantarflexion    Ankle inversion    Ankle eversion     (Blank rows = not tested)  JOINT MOBILITY Short and long axis distraction assessment Right hip: hypomobile, pain decreased  Left hip: hypomobile, pain decreased  LOWER EXTREMITY SPECIAL TESTS:  FADDIR: positive bilaterally SLR Test: negative for provocation of familiar symptoms  FUNCTIONAL TESTS:  5xSTS: 12.54 seconds  GAIT: Distance walked: 65 feet Assistive device utilized: None Level of assistance:  Complete Independence Comments: Pt ambulates with reciprocal gait pattern and decreased arm swing in RUE noted. Pt observed to ambulate with hips positioned slightly in externally rotated position with otherwise unremarkable compensations or significant abnormalities noted.   STAIRS: Pt ascended and descended steps with reciprocal gait pattern and without the use of HRs for UE support. Pt observed to negotiate stairs with hips positioned slightly in externally rotated position with otherwise unremarkable compensations or significant abnormalities noted.  5xSTS: 8.75 seconds         04/02/2024 LEFs: 69 out of 80 Right hip flexion: degrees MMT Strength measurements                      Right             Left Hip extension 4+ 4+  Hip abduction (sidelying) 5 5  TREATMENT DATE: 04/09/2024   Subjective:  Pt presents to physical therapy without any significant pain in bilateral hips at start of tx session.   Therapeutic Activity:  Prone hip extensions, 3#, 2 x 8 bilaterally  Sidelying hip abduction in modified plank position, 3#, 2 x 12 TG double leg Squats, 30#, 2 x 8 bilaterally  TG single leg Squats, 30#, 2 x 8 bilaterally  Forward and lateral step-ups to 12 stair, 8# med ball, (eccentric control focus upon descent), 1 x 20 each  Walking forward marches in // bars with 5# AWs, 3 x down and back  Walking lunges in // bars with 9# Dbs, 4 x down and back   Manual tx.: Supine LE generalized stretches/PROM of bilateral hips (focus on hip IR/ER)- limited R hip IR as compared to L.  Moderate B hip hypomobility noted.   Supine LAD to R LE to increase hip mobility/ decrease pain. Supine R hip grade III-IV mobs. (Inferior/ lateral)- MWM to promote increase L hip flexion/ IR.  3x30 sec. Each (no increase c/o pain)  Contract-relax technique of left hip to increase IR in supine position    Active ROM Right eval Left eval  Hip flexion 93 deg 97 deg    Not performed today:  Supine: knee to chest (focus on hip flexion to 90 deg. (Focus on maintaining hip in midline) Standing hip 3-way 20x each (no ankle wts.) TRX Squats, 2 x 10 Supine single leg Bridges, 2 x 10 bilaterally Lateral Step ups to 12-inch stair, 7# DB, 2 x 20 bilaterally  Supine SL Bridges, 2 x 8  Lateral walking in // bars with blue TB above ankles, 3 x down and back Single Leg RDL, 2 x 10 bilaterally with 30# DB   Lateral resisted walking on N[place]ilus, 140#, 1 x 4 bilaterally   PATIENT EDUCATION:  Education details: Pt educated on anatomy involved, diagnosis, prognosis, POC, HEP, and current gym program/other recommendations for activity to assist with current right hip pain Person educated: Patient Education method: Explanation, Demonstration, Tactile cues, Verbal cues, and Handouts Education comprehension: verbalized understanding and returned demonstration  HOME EXERCISE PROGRAM: Access Code: [email or web address] URL: [email or web address] Date: 02/25/2024 Prepared by: Ozell Sero Exercises - Standing Hip Abduction with Counter Support - 1 x daily - 3 x weekly - 2 sets - 15 reps - Standing Hip Extension with Counter Support - 1 x daily - 3 x weekly - 2 sets - 15 reps - Seated March - 1 x daily - 3 x weekly - 2 sets - 15 reps   Access Code: [email or web address] URL: [email or web address] Date: 03/05/2024 Prepared by: Ozell Sero Exercises - Side Stepping with Resistance at Ankles - 1 x daily - 5 x weekly - 2 sets - 12 reps - Standing 3-Way Leg Reach with Resistance at Ankles and Counter Support - 1 x daily - 5 x weekly - 2 sets - 12 reps - Bridge - 1 x daily - 5 x weekly - 2 sets - 12 rep   ASSESSMENT:  CLINICAL IMPRESSION: Patient presents without significant bilateral hip pain on this day. Continued with manual therapy interventions to address hypomobility and limitations with ROM. Measured hip  flexion in supine position on this day with left hip: 97 deg and right hip: 93 deg. Pt progressed to completing forward and lateral step ups/downs to 12-inch stair on this day which she completed without significant difficulty or increased pain. Pt responded well to all manual therapy and therapeutic exercise interventions without reporting  an increase in pain at end of tx session.   Pt has attended and actively participated in 12 physical therapy sessions to date. Pt has experienced improvements with decreased bilateral hip pain, ease with functional transfers, gross LE strength, bilateral hip mobility, and self perceived ability to complete daily household tasks with greater ease. Pt to be discharged from physical therapy at this time with plans to continue gym program independently after achieving most of her initial goals.    OBJECTIVE IMPAIRMENTS: Abnormal gait, decreased ROM, decreased strength, hypomobility, and pain.   ACTIVITY LIMITATIONS: carrying, bending, standing, squatting, sleeping, stairs, locomotion level, and caring for others  PARTICIPATION LIMITATIONS: meal prep, cleaning, laundry, shopping, and community activity  PERSONAL FACTORS: Age are also affecting patient's functional outcome.   REHAB POTENTIAL: Good  CLINICAL DECISION MAKING: Stable/uncomplicated  EVALUATION COMPLEXITY: Low  GOALS: Goals reviewed with patient? Yes  SHORT TERM GOALS: Target date: 03/17/2024 Pt to experience no greater than 5/10 pain at its worst so that she can sleep at night with greater ease.  Baseline:9/10 at worst; (10/27): 4/10 at worst Goal status: GOAL MET  2.  Pt will perform 5xSTS in < 10 seconds to allow for improvement with functional transfers with greater ease.  Baseline: 12.54 seconds; (10/27): 8.75 seconds  Goal status: GOAL MET  LONG TERM GOALS: Target date: 04/09/2024  Pt to increase bilateral hip extension and abduction to at least a 4+/5 to improve ability with prolonged  walking.  Baseline: see above, (11/13): 4+/5 bilaterally extension, 5/5 bilaterally abduction Goal status: GOAL MET  2.  Pt to increase LEFs score to at least 66 out of 80 to improve self perceived ability to complete daily household tasks without limitations.  Baseline: 56 out of 80, (11/13): 69 out of 80 Goal status: GOAL MET  3.  Pt to increase right hip flexion AROM to at least 95 degrees to become symmetrical bilaterally and allow for greater ease with climbing stairs.  Baseline: 90 degrees. (11/20): Left: 97 deg Right: 93 deg Goal status: NOT MET  PLAN:  PT FREQUENCY: 1x/week  PT DURATION: 1 time visit  PLANNED INTERVENTIONS: 97164- PT Re-evaluation, 97110-Therapeutic exercises, 97530- Therapeutic activity, 97112- Neuromuscular re-education, 97535- Self Care, [id number]- Manual therapy, Patient/Family education, Joint mobilization, Cryotherapy, and Moist heat  PLAN FOR NEXT SESSION: Pt to be discharged from physical therapy services at this time.   Curtistine Bracket, SPT  Ozell JAYSON Sero, PT, DPT # [id number] 04/09/2024, 12:04 PM

## 2024-04-10 ENCOUNTER — Other Ambulatory Visit (HOSPITAL_COMMUNITY): Payer: Self-pay

## 2024-04-10 ENCOUNTER — Telehealth: Payer: Self-pay | Admitting: Pharmacy Technician

## 2024-04-10 NOTE — Telephone Encounter (Signed)
 Please review.  KP

## 2024-04-10 NOTE — Telephone Encounter (Signed)
 PA request has been Received. New Encounter has been or will be created for follow up. For additional info see Pharmacy Prior Auth telephone encounter from 04/10/2024.

## 2024-04-10 NOTE — Telephone Encounter (Signed)
 Pharmacy Patient Advocate Encounter   Received notification from Patient Advice Request messages that prior authorization for Zepbound  2.5 mg/0.5ml pen is required/requested.   Insurance verification completed.   The patient is insured through [employer].   Per test claim: Insurance companies are becoming increasingly stricter about requiring thorough documentation of lifestyle modifications in the patient's chart at each visit. This includes detailed records of diet recommendations (caloric intake, etc), exercise plans (amount of time/wk, etc), and an emphasis on the patient's commitment to continuing these efforts while on medication.  Without this additional documentation in the chart notes, a prior authorization will most likely be denied.  **Also is this a renewal or first initial?

## 2024-04-29 ENCOUNTER — Other Ambulatory Visit (HOSPITAL_COMMUNITY): Payer: Self-pay

## 2024-04-29 NOTE — Telephone Encounter (Signed)
 Hi Alexandra Salinas, telephone encounter 04/10/24 I documented that we needed important information to send in the PA for her zepbound .  We need current weight and BMI. If this PA is a renewal then we also need progression notes as well.  Thanks!

## 2024-05-07 ENCOUNTER — Other Ambulatory Visit (HOSPITAL_COMMUNITY): Payer: Self-pay

## 2024-05-12 ENCOUNTER — Ambulatory Visit

## 2024-05-12 DIAGNOSIS — F4320 Adjustment disorder, unspecified: Secondary | ICD-10-CM | POA: Insufficient documentation

## 2024-05-12 DIAGNOSIS — F418 Other specified anxiety disorders: Secondary | ICD-10-CM | POA: Insufficient documentation

## 2024-05-12 DIAGNOSIS — F3342 Major depressive disorder, recurrent, in full remission: Secondary | ICD-10-CM | POA: Insufficient documentation
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
## 2024-05-26 ENCOUNTER — Other Ambulatory Visit: Payer: Self-pay

## 2024-05-26 ENCOUNTER — Encounter: Payer: Self-pay | Admitting: Psychiatry

## 2024-05-26 ENCOUNTER — Ambulatory Visit (INDEPENDENT_AMBULATORY_CARE_PROVIDER_SITE_OTHER): Admitting: Psychiatry

## 2024-05-26 VITALS — BP 112/68 | HR 62 | Temp 97.8°F | Ht 64.0 in | Wt 147.6 lb

## 2024-05-26 DIAGNOSIS — F418 Other specified anxiety disorders: Secondary | ICD-10-CM | POA: Diagnosis not present

## 2024-05-26 DIAGNOSIS — F3342 Major depressive disorder, recurrent, in full remission: Secondary | ICD-10-CM | POA: Diagnosis not present

## 2024-05-26 NOTE — Progress Notes (Unsigned)
 BH MD OP Progress Note  05/26/2024 5:04 PM Alexandra Salinas  MRN:  [id number]  Chief Complaint:  Chief Complaint  Patient presents with   Follow-up   Anxiety   Depression   Medication Refill   Discussed the use of AI scribe software for clinical note transcription with the patient, who gave verbal consent to proceed.  History of Present Illness Alexandra Salinas is a 49 year old Caucasian female, employed, married, lives in [place], has a history of anxiety, depression was evaluated in office today for a follow-up appointment.  She reports that her depression has remained manageable overall. Previously, she experienced significant seasonal depression while she was in [place]  during the holidays due to the cold and early darkness, but she no longer experiences this since moving back to [place] . During the recent holiday season, she felt overwhelmed, particularly while traveling to [place]  but now feels better.  She reports that her anxiety increased recently, especially during the holidays. She had not previously recognized these symptoms as anxiety but identified them through experiences such as extreme irritability and intrusive worries about potential accidents while traveling. She reports that her anxiety peaked during this period but has improved since returning home. She has started working with a new therapist Ms.Khawaja and has, having attended 1 session, and she has scheduled her second session for the following day. She describes her therapist as excellent. As a coping strategy for anxiety, she uses humming, as her therapist recommended.  She states that she currently takes Lexapro  and buspirone  5 mg twice daily for anxiety.  She denies side effects.  Regarding sleep, she continues to experience difficulties, which she primarily attributes to night sweats. She notes that she stopped hormone replacement therapy (HRT) for a period because she felt it was not helping,  but she has recently restarted it. She currently takes both the estradiol patch and progesterone, and she believes progesterone is important for sleep, especially in managing night sweats. She reports that disrupted sleep during travel, including her daughter waking and joining her in bed, further impacted her rest. She is not interested in sleep medication at this time.  She reports a good appetite. She denies suicidal thoughts.  She denies any homicidality or perceptual disturbances.    She denies any other concerns today.   Visit Diagnosis:    ICD-10-CM   1. Recurrent major depressive disorder, in full remission  F33.42     2. Other specified anxiety disorders  F41.8    Generalized anxiety not occurring more days than not      Past Psychiatric History: I have reviewed past psychiatric history from progress note on 10/07/2023.  Past Medical History:  Past Medical History:  Diagnosis Date   Anxiety    Basal cell carcinoma 2023   right forehead, Mohs   Depression    Diverticulitis    GERD (gastroesophageal reflux disease)    H/O basal cell carcinoma excision 05/23/2023    Past Surgical History:  Procedure Laterality Date   BASAL CELL CARCINOMA EXCISION  10/2021   forehead   CESAREAN SECTION  2021   COLONOSCOPY  2020   COLONOSCOPY N/A 12/09/2023   Procedure: COLONOSCOPY;  Surgeon: Jinny Carmine, MD;  Location: [facility] ENDOSCOPY;  Service: Endoscopy;  Laterality: N/A;   ESOPHAGOGASTRODUODENOSCOPY  11/2020   ESOPHAGOGASTRODUODENOSCOPY N/A 12/09/2023   Procedure: EGD (ESOPHAGOGASTRODUODENOSCOPY);  Surgeon: Jinny Carmine, MD;  Location: [facility] ENDOSCOPY;  Service: Endoscopy;  Laterality: N/A;   REFRACTIVE SURGERY Bilateral 2015  Family Psychiatric History: I have reviewed family psychiatric history from progress note on 10/07/2023.  Family History:  Family History  Problem Relation Age of Onset   Anxiety disorder Mother    Depression Mother    Kidney disease Mother     Hypertension Mother    Diabetes Mother    Vascular Disease Mother    Dementia Mother    Stroke Mother    Heart disease Father    Colon cancer Father    Drug abuse Brother    Hyperlipidemia Brother    Hypertension Brother    Depression Brother    Alcoholism Brother    Depression Maternal Aunt    Anxiety disorder Maternal Aunt    Anxiety disorder Maternal Uncle    Depression Maternal Uncle    Stroke Maternal Grandfather    Diabetes Maternal Grandmother    Heart disease Maternal Grandmother    Lung cancer Paternal Grandmother     Social History: I have reviewed social history from progress note on 10/07/2023. Social History   Socioeconomic History   Marital status: Married    Spouse name: Amabel Stmarie   Number of children: 1   Years of education: 16   Highest education level: Bachelor's degree (e.g., BA, AB, BS)  Occupational History   Not on file  Tobacco Use   Smoking status: Former    Current packs/day: 0.00    Average packs/day: 1 pack/day for 20.0 years (20.0 ttl pk-yrs)    Types: Cigarettes    Start date: 60    Quit date: 2017    Years since quitting: 9.0   Smokeless tobacco: Never  Vaping Use   Vaping status: Never Used  Substance and Sexual Activity   Alcohol use: Yes    Alcohol/week: 7.0 standard drinks of alcohol    Types: 7 Glasses of wine per week    Comment: occ   Drug use: Never   Sexual activity: Yes    Partners: Male  Other Topics Concern   Not on file  Social History Narrative   Not on file   Social Drivers of Health   Tobacco Use: Medium Risk (05/26/2024)   Patient History    Smoking Tobacco Use: Former    Smokeless Tobacco Use: Never    Passive Exposure: Not on file  Financial Resource Strain: Low Risk (01/23/2024)   Overall Financial Resource Strain (CARDIA)    Difficulty of Paying Living Expenses: Not hard at all  Food Insecurity: No Food Insecurity (01/23/2024)   Epic    Worried About Radiation Protection Practitioner of Food in the Last Year: Never  true    Ran Out of Food in the Last Year: Never true  Transportation Needs: No Transportation Needs (01/23/2024)   Epic    Lack of Transportation (Medical): No    Lack of Transportation (Non-Medical): No  Physical Activity: Sufficiently Active (01/23/2024)   Exercise Vital Sign    Days of Exercise per Week: 4 days    Minutes of Exercise per Session: 60 min  Stress: No Stress Concern Present (01/23/2024)   [employer] of Occupational Health - Occupational Stress Questionnaire    Feeling of Stress: Only a little  Recent Concern: Stress - Stress Concern Present (10/29/2023)   [employer] of Occupational Health - Occupational Stress Questionnaire    Feeling of Stress : To some extent  Social Connections: Moderately Integrated (01/23/2024)   Social Connection and Isolation Panel    Frequency of Communication with Friends and Family: More than three  times a week    Frequency of Social Gatherings with Friends and Family: Twice a week    Attends Religious Services: Never    Database Administrator or Organizations: Yes    Attends Banker Meetings: 1 to 4 times per year    Marital Status: Married  Depression (PHQ2-9): Medium Risk (05/12/2024)   Depression (PHQ2-9)    PHQ-2 Score: 6  Alcohol Screen: Low Risk (01/23/2024)   Alcohol Screen    Last Alcohol Screening Score (AUDIT): 3  Housing: Low Risk (01/23/2024)   Epic    Unable to Pay for Housing in the Last Year: No    Number of Times Moved in the Last Year: 0    Homeless in the Last Year: No  Utilities: Not At Risk (05/11/2022)   AHC Utilities    Threatened with loss of utilities: No  Health Literacy: Adequate Health Literacy (05/12/2024)   B1300 Health Literacy    Frequency of need for help with medical instructions: Never    Allergies: Allergies[1]  Metabolic Disorder Labs: Lab Results  Component Value Date   HGBA1C 5.2 05/23/2023   No results found for: PROLACTIN Lab Results  Component Value Date   CHOL  197 05/23/2023   TRIG 115 05/23/2023   HDL 51 05/23/2023   CHOLHDL 3.9 05/23/2023   LDLCALC 125 (H) 05/23/2023   LDLCALC 118 (H) 04/16/2023   Lab Results  Component Value Date   TSH 1.720 05/23/2023   TSH 2.070 05/09/2022    Therapeutic Level Labs: No results found for: LITHIUM No results found for: VALPROATE No results found for: CBMZ  Current Medications: Current Outpatient Medications  Medication Sig Dispense Refill   busPIRone  (BUSPAR ) 5 MG tablet Take 1 tablet (5 mg total) by mouth 2 (two) times daily. 180 tablet 3   cholecalciferol (VITAMIN D3) 25 MCG (1000 UNIT) tablet Take 1,000 Units by mouth daily.     escitalopram  (LEXAPRO ) 20 MG tablet Take 1 tablet by mouth once daily with breakfast 90 tablet 0   estradiol (ESTRACE) 0.1 MG/GM vaginal cream Place 1 Applicatorful vaginally daily.     estradiol (VIVELLE-DOT) 0.1 MG/24HR patch Place 1 patch onto the skin 2 (two) times a week.     famotidine (PEPCID) 20 MG tablet Take 20 mg by mouth 2 (two) times daily.     progesterone (PROMETRIUM) 100 MG capsule Take 100 mg by mouth daily.     TURMERIC PO Take by mouth.     No current facility-administered medications for this visit.     Musculoskeletal: Strength & Muscle Tone: within normal limits Gait & Station: normal Patient leans: N/A  Psychiatric Specialty Exam: Review of Systems  Psychiatric/Behavioral:  Positive for sleep disturbance. The patient is nervous/anxious.     Blood pressure 112/68, pulse 62, temperature 97.8 F (36.6 C), temperature source Temporal, height 5' 4 (1.626 m), weight 147 lb 9.6 oz (67 kg).Body mass index is 25.34 kg/m.  General Appearance: Casual  Eye Contact:  Fair  Speech:  Clear and Coherent  Volume:  Normal  Mood:  Anxious  Affect:  Appropriate  Thought Process:  Goal Directed and Descriptions of Associations: Intact  Orientation:  Full (Time, Place, and Person)  Thought Content: Logical   Suicidal Thoughts:  No  Homicidal  Thoughts:  No  Memory:  Immediate;   Fair Recent;   Fair Remote;   Fair  Judgement:  Fair  Insight:  Fair  Psychomotor Activity:  Normal  Concentration:  Concentration:  Fair and Attention Span: Fair  Recall:  [employer] of Knowledge: Fair  Language: Fair  Akathisia:  No  Handed:  Right  AIMS (if indicated): not done  Assets:  Communication Skills Desire for Improvement Housing Intimacy Social Support Transportation  ADL's:  Intact  Cognition: WNL  Sleep:  Poor   Screenings: AUDIT    Flowsheet Row Office Visit from 01/24/2024 in [facility] at [facility] Office Visit from 10/30/2023 in [facility] at [facility] Office Visit from 03/29/2023 in [facility] & [facility] Medicine at [facility] Office Visit from 10/18/2022 in [facility] & [facility] Medicine at [facility]  Alcohol Use Disorder Identification Test Final Score (AUDIT) 3  3  3  3    GAD-7    Flowsheet Row Counselor from 05/12/2024 in [facility] Office Visit from 01/24/2024 in [facility] Care & [facility] Medicine at [facility] Office Visit from 10/30/2023 in [facility] at [facility] Office Visit from 10/07/2023 in [facility] Office Visit from 06/06/2023 in [facility] at [facility]  Total GAD-7 Score 10 5 4 4  0   PHQ2-9    Flowsheet Row Counselor from 05/12/2024 in [facility] Office Visit from 02/24/2024 in [facility] Office Visit from 01/24/2024 in [facility] Medicine at [facility] Office Visit from 10/30/2023 in [facility] Primary Care & Sports Medicine at [facility] Office Visit from 10/07/2023 in [facility]  PHQ-2 Total  Score 2 2 2 2 2   PHQ-9 Total Score 6 5 7 6 6    Flowsheet Row Counselor from 05/12/2024 in [facility] Office Visit from 02/24/2024 in [facility] Admission (Discharged) from 12/09/2023 in [facility]  C-SSRS RISK CATEGORY No Risk No Risk No Risk     Assessment and Plan: Na Waldrip is a 49 year old Caucasian female who presented for a follow-up appointment, discussed assessment and plan as noted below.  1. Recurrent major depressive disorder, in full remission Currently denies any significant depression symptoms other than sleep which is likely due to night sweats.  Currently back on HRT and would like to give it more time.  Not interested in initiation of a sleep medication at this time. Continue Lexapro  20 mg daily Continue BuSpar  5 mg twice a day Continue psychotherapy sessions with Ms.Shahnaz Khawaja  2. Other specified anxiety disorders-improving Currently reports anxiety symptoms is improving and is motivated to continue psychotherapy sessions Continue Lexapro  and BuSpar  as prescribed Continue psychotherapy sessions .  Follow-up Follow-up in clinic in 2 to 3 months or sooner if needed.   Collaboration of Care: Collaboration of Care: Referral or follow-up with counselor/therapist AEB I have reviewed notes per psychotherapist Ms.Khawaja dated 05/12/2024  Patient/Guardian was advised Release of Information must be obtained prior to any record release in order to collaborate their care with an outside provider. Patient/Guardian was advised if they have not already done so to contact the registration department to sign all necessary forms in order for us  to release information regarding their care.   Consent: Patient/Guardian gives verbal consent for treatment and assignment of benefits for services provided during this visit. Patient/Guardian expressed understanding  and agreed to proceed.  This note  was generated in part or whole with voice recognition software. Voice recognition is usually quite accurate but there are transcription errors that can and very often do occur. I apologize for any typographical errors that were not detected and corrected.     Tyquarius Paglia, MD 05/26/2024, 5:04 PM     [1] No Known Allergies

## 2024-05-27 ENCOUNTER — Ambulatory Visit (INDEPENDENT_AMBULATORY_CARE_PROVIDER_SITE_OTHER)

## 2024-05-27 DIAGNOSIS — F4320 Adjustment disorder, unspecified: Secondary | ICD-10-CM

## 2024-05-27 DIAGNOSIS — F418 Other specified anxiety disorders: Secondary | ICD-10-CM

## 2024-05-27 DIAGNOSIS — F3342 Major depressive disorder, recurrent, in full remission: Secondary | ICD-10-CM
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
## 2024-06-01 ENCOUNTER — Encounter: Payer: Self-pay | Admitting: Family Medicine

## 2024-06-01 ENCOUNTER — Ambulatory Visit (INDEPENDENT_AMBULATORY_CARE_PROVIDER_SITE_OTHER): Admitting: Family Medicine

## 2024-06-01 VITALS — BP 90/60 | HR 67 | Ht 64.0 in | Wt 145.0 lb

## 2024-06-01 DIAGNOSIS — M160 Bilateral primary osteoarthritis of hip: Secondary | ICD-10-CM | POA: Diagnosis not present

## 2024-06-01 DIAGNOSIS — E782 Mixed hyperlipidemia: Secondary | ICD-10-CM | POA: Diagnosis not present

## 2024-06-01 DIAGNOSIS — E669 Obesity, unspecified: Secondary | ICD-10-CM

## 2024-06-01 DIAGNOSIS — F418 Other specified anxiety disorders: Secondary | ICD-10-CM | POA: Diagnosis not present

## 2024-06-01 DIAGNOSIS — K219 Gastro-esophageal reflux disease without esophagitis: Secondary | ICD-10-CM | POA: Insufficient documentation

## 2024-06-01 DIAGNOSIS — Z23 Encounter for immunization: Secondary | ICD-10-CM | POA: Diagnosis not present

## 2024-06-01 DIAGNOSIS — Z0000 Encounter for general adult medical examination without abnormal findings: Secondary | ICD-10-CM

## 2024-06-01 DIAGNOSIS — M62838 Other muscle spasm: Secondary | ICD-10-CM

## 2024-06-01 NOTE — Progress Notes (Signed)
 "    Annual Physical Exam Visit  Patient Information:  Patient ID: Alexandra Salinas, female DOB: November 22, 1975 Age: 49 y.o. MRN: [id number]   Subjective:   CC: Annual Physical Exam  HPI:  Alexandra Salinas is here for their annual physical.  I reviewed the past medical history, family history, social history, surgical history, and allergies today and changes were made as necessary.  Please see the problem list section below for additional details.  Past Medical History: Past Medical History:  Diagnosis Date   Anxiety    Basal cell carcinoma 2023   right forehead, Mohs   Carpal tunnel syndrome on left 06/12/2022   Depression    Diverticulitis    GERD (gastroesophageal reflux disease)    H/O basal cell carcinoma excision 05/23/2023   Past Surgical History: Past Surgical History:  Procedure Laterality Date   BASAL CELL CARCINOMA EXCISION  10/2021   forehead   CESAREAN SECTION  2021   COLONOSCOPY  2020   COLONOSCOPY N/A 12/09/2023   Procedure: COLONOSCOPY;  Surgeon: Jinny Carmine, MD;  Location: [facility] ENDOSCOPY;  Service: Endoscopy;  Laterality: N/A;   ESOPHAGOGASTRODUODENOSCOPY  11/2020   ESOPHAGOGASTRODUODENOSCOPY N/A 12/09/2023   Procedure: EGD (ESOPHAGOGASTRODUODENOSCOPY);  Surgeon: Jinny Carmine, MD;  Location: [facility] ENDOSCOPY;  Service: Endoscopy;  Laterality: N/A;   REFRACTIVE SURGERY Bilateral 2015   Family History: Family History  Problem Relation Age of Onset   Anxiety disorder Mother    Depression Mother    Kidney disease Mother    Hypertension Mother    Diabetes Mother    Vascular Disease Mother    Dementia Mother    Stroke Mother    Heart disease Father    Colon cancer Father    Drug abuse Brother    Hyperlipidemia Brother    Hypertension Brother    Depression Brother    Alcoholism Brother    Depression Maternal Aunt    Anxiety disorder Maternal Aunt    Anxiety disorder Maternal Uncle    Depression Maternal Uncle    Stroke Maternal Grandfather     Diabetes Maternal Grandmother    Heart disease Maternal Grandmother    Lung cancer Paternal Grandmother    Allergies: Allergies[1] Health Maintenance: Health Maintenance  Topic Date Due   COVID-19 Vaccine (1) 06/17/2024 (Originally 07/19/1976)   Lung Cancer Screening  01/23/2025 (Originally 01/18/2024)   Pneumococcal Vaccine: 50+ Years (1 of 1 - PCV) 01/23/2025 (Originally 01/18/2024)   Hepatitis B Vaccines 19-59 Average Risk (1 of 3 - 19+ 3-dose series) 01/23/2025 (Originally 01/17/1993)   Zoster Vaccines- Shingrix  (2 of 2) 07/27/2024   Mammogram  09/15/2025   Cervical Cancer Screening (HPV/Pap Cotest)  06/05/2028   Colonoscopy  12/08/2028   Hepatitis C Screening  Completed   HIV Screening  Completed   HPV VACCINES  Aged Out   Meningococcal B Vaccine  Aged Out   DTaP/Tdap/Td  Discontinued   Influenza Vaccine  Discontinued    HM Colonoscopy          Upcoming     Colonoscopy (Every 5 Years) Next due on 12/08/2028    12/09/2023  COLONOSCOPY  Only the first 1 history entries have been loaded, but more history exists.              Medications: Medications Ordered Prior to Encounter[2]  Discussed the use of AI scribe software for clinical note transcription with the patient, who gave verbal consent to proceed.   Objective:   Vitals:   06/01/24 0856  BP: 90/60  Pulse: 67  SpO2: 99%   Vitals:   06/01/24 0856  Weight: 145 lb (65.8 kg)  Height: 5' 4 (1.626 m)   Body mass index is 24.89 kg/m.  General: Well Developed, well nourished, and in no acute distress.  Neuro: Alert and oriented x3, extra-ocular muscles intact, sensation grossly intact. Cranial nerves II through XII are grossly intact, motor, sensory, and coordinative functions are intact. HEENT: Normocephalic, atraumatic, neck supple, no masses, no lymphadenopathy, thyroid  nonenlarged. Oropharynx, nasopharynx, external ear canals are unremarkable. Skin: Warm and dry, no rashes noted.  Cardiac: Regular  rate and rhythm, no murmurs rubs or gallops. No peripheral edema. Pulses symmetric. Respiratory: Clear to auscultation bilaterally. Speaking in full sentences.  Abdominal: Soft, nontender, nondistended, positive bowel sounds, no masses, no organomegaly. Musculoskeletal: Stable, and with full range of motion.  Impression and Recommendations:   History of Present Illness Alexandra Salinas is a 49 year old female with obesity who presents for follow-up of musculoskeletal symptoms.  Hip Joint Pain and Mobility: - Pain has fluctuated but significantly improved with physical therapy, particularly movement-based interventions - Increased mobility and reduced pain  Cervical Paraspinal Muscle Spasm: - Persistent neck tightness and paraspinal muscle spasm localized to the cervical region - No new neurological symptoms  Weight Loss and Metabolic Health: - Sustained weight loss over the past two years resulting in improved overall health - Resolution of left carpal tunnel symptoms - Improved blood pressure - Mild weight regain over the holidays  Mental Health Status: - Improved mental clarity and well-being following recent engagement in psychotherapy  Assessment and Plan Annual physical examination Routine annual physical examination for preventive care. No acute complaints or abnormal findings. Blood pressure and general health are well maintained. - Performed comprehensive physical examination. - Ordered routine laboratory studies including metabolic panel, glucose, and cholesterol. - Scheduled follow-up annual physical for January 2027. - Reviewed need for routine mammogram; last completed in 2024, will monitor for next due date. - Recommended shingles vaccine and scheduled nurse visit for administration.  Obesity - resolved Obesity previously managed with weight loss medication and lifestyle modification. Significant improvement in blood pressure and joint pain with weight loss.  Mild weight gain noted over holidays, but overall weight remains well controlled. - Provided positive reinforcement for weight management and lifestyle changes. - Discussed impact of weight loss on comorbidities including blood pressure and joint pain. - Encouraged continued adherence to weight management strategies.  Hip joint pain Chronic hip joint pain previously treated with physical therapy. Significant improvement with increased mobility and reduced pain. - Reinforced importance of continued movement and physical therapy exercises. - Advised maintenance of regular activity and adherence to physical therapy recommendations. - Encouraged reporting of any changes in symptoms.  Paraspinal muscle spasm Chronic paraspinal muscle spasm likely related to tension. Psychotherapy providing significant benefit in addressing underlying stress and tension. - Encouraged continuation of psychotherapy.  Gastroesophageal reflux disease Gastroesophageal reflux disease well controlled on famotidine. Recent endoscopy showed no evidence of Barrett's esophagus; a small ulcer was noted but no further endoscopic follow-up is required at this time. - Continued famotidine therapy. - No repeat endoscopy indicated per gastroenterology recommendations. - Advised reporting of any changes in symptoms.  Generalized anxiety disorder and depression Generalized anxiety disorder and depression, well-managed on buspirone  and escitalopram . Significant improvement in mental health and resolution of longstanding issues with psychotherapy. - Continued buspirone  and escitalopram  at current doses. - Encouraged ongoing participation in psychotherapy. - Reinforced importance of therapeutic  relationship and connection with current therapist.  The patient was counselled, risk factors were discussed, and anticipatory guidance given.  Problem List Items Addressed This Visit     Cervical paraspinal muscle spasm   Depression with  anxiety   Healthcare maintenance - Primary   Relevant Orders   CBC   Comprehensive metabolic panel with GFR   Hemoglobin A1c   Lipid panel   Varicella-zoster vaccine IM (Completed)   Mixed hyperlipidemia   Obesity with serious comorbidity   Primary osteoarthritis of hips, bilateral     Orders & Medications Medications: No orders of the defined types were placed in this encounter.  Orders Placed This Encounter  Procedures   Varicella-zoster vaccine IM   CBC   Comprehensive metabolic panel with GFR   Hemoglobin A1c   Lipid panel     No follow-ups on file.    Selinda JINNY Ku, MD, CAQSM   Primary Care Sports [facility]      [1] No Known Allergies [2]  Current Outpatient Medications on File Prior to Visit  Medication Sig Dispense Refill   busPIRone  (BUSPAR ) 5 MG tablet Take 1 tablet (5 mg total) by mouth 2 (two) times daily. 180 tablet 3   cholecalciferol (VITAMIN D3) 25 MCG (1000 UNIT) tablet Take 1,000 Units by mouth daily.     escitalopram  (LEXAPRO ) 20 MG tablet Take 1 tablet by mouth once daily with breakfast 90 tablet 0   estradiol (ESTRACE) 0.1 MG/GM vaginal cream Place 1 Applicatorful vaginally daily.     estradiol (VIVELLE-DOT) 0.1 MG/24HR patch Place 1 patch onto the skin 2 (two) times a week.     famotidine (PEPCID) 20 MG tablet Take 20 mg by mouth 2 (two) times daily.     progesterone (PROMETRIUM) 100 MG capsule Take 100 mg by mouth daily.     TURMERIC PO Take by mouth.     No current facility-administered medications on file prior to visit.   "

## 2024-06-01 NOTE — Progress Notes (Unsigned)
" ° °  THERAPIST PROGRESS NOTE  Session Time: 49  Participation Level: Active  Behavioral Response: CasualAlertEuthymic  Type of Therapy: Individual Therapy  Treatment Goals addressed: STG: Trevor will participate in therapy to identify root causes of anxiety and learn and develop skills to manage symptoms to reduce reactivity caused by anxiety from daily to no more than three times a week.  STG: Shalece will participate in therapy to learn and develop skills to reduce the impact of the daily symptoms of depression from daily to no more than three times a week.   ProgressTowards Goals: Progressing  Interventions: CBT and Meditation: EMDR  Summary: Alexandra Salinas is a 49 y.o. female who presents with a history of anxiety and depression. Therapist greeted Alexandra Salinas warmly and spent a few minutes checking in and discussing how things have been since the last session. Alexandra Salinas presents for session alert and oriented; mood and affect neutral. Speech is clear and coherent at normal rate and tone. Engaged and cooperative in session. Therapist spent the first part of session reviewing treatment plan and goals to assess management of behavioral symptoms and any safety concerns and current level of functioning. Alexandra Salinas denies safety concerns and continues to work towards goals. Alexandra Salinas shared feeling tremendous relief  after EMDR last session.   Therapist spent the second part of session on psychoeducation, skill development and therapeutic interventions in session. Alexandra Salinas reported tension around current political climate and fear she has been experiencing. Alexandra Salinas reported feelings of fear speaking     up for herself in situations of political  tension. Therapist used float back to identify a scientist, research (physical sciences). EMDR was done in session. Alexandra Salinas responded well to the interventions and shared clarity and reduction in intensity of emotions. A wrap up meditation activity was done at the end of the session and Alexandra Salinas  responded well to it.   Suicidal/Homicidal: Nowithout intent/plan  Therapist Response: Therapist used Motivational Interviewing to facilitate discussion, elicit pertinent information and summarized thoughts and feelings for clarity and accuracy. Used supportive language and validation to provide a safe space to process thoughts and feelings. Therapist used EMDR  in session as therapeutic techniques to address presenting concerns. CBT was used for cognitive restructuring. Therapist noted Alexandra Salinas experiencing very real feelings of powerlessness prior to  intervention and noticed relief after EMDR. Intensity was brought down from a 10 -0.  Therapist reviewed and summarized what was discussed in session today and asked Alexandra Salinas if there were any questions and provided clarity as needed. No safety concerns were noted during session and SI/HI/AVH were not present. Homework was assigned. Therapist wrapped up with a mindfulness based meditation activity and a follow up meeting time was scheduled.   Plan: Return again in 1 weeks.  Diagnosis: Other specified anxiety disorders  Collaboration of Care: Medication Management AEB chart review  Patient/Guardian was advised Release of Information must be obtained prior to any record release in order to collaborate their care with an outside provider. Patient/Guardian was advised if they have not already done so to contact the registration department to sign all necessary forms in order for us  to release information regarding their care.   Consent: Patient/Guardian gives verbal consent for treatment and assignment of benefits for services provided during this visit. Patient/Guardian expressed understanding and agreed to proceed.   Curtiss RAYMOND Carrie 06/02/2024  "

## 2024-06-01 NOTE — Patient Instructions (Addendum)
-   Obtain fasting labs with orders provided (can have water or black coffee but otherwise no food or drink x 8 hours before labs) - Review information provided - Attend eye doctor annually, dentist every 6 months, work towards or maintain 30 minutes of moderate intensity physical activity at least 5 days per week, and consume a balanced diet - Return in 1 year for physical - Contact us  for any questions between now and then   VISIT SUMMARY:  Today, you had a follow-up appointment to review your musculoskeletal symptoms, weight management, and overall health. Your hip joint pain has improved with physical therapy, and your neck tightness persists but is manageable. Your weight loss has positively impacted your health, and your mental clarity has improved with psychotherapy.  YOUR PLAN:  ANNUAL PHYSICAL EXAMINATION: Routine annual physical examination for preventive care. No acute complaints or abnormal findings. -Performed comprehensive physical examination. -Ordered routine laboratory studies including metabolic panel, glucose, and cholesterol. -Scheduled follow-up annual physical for January 2027. -Reviewed need for routine mammogram; last completed in 2024, will monitor for next due date. -Recommended shingles vaccine and scheduled nurse visit for administration.  WEIGHT MANAGEMENT: Previously managed with weight loss medication and lifestyle modification. Mild weight gain noted over holidays, but overall weight remains well controlled. -Provided positive reinforcement for weight management and lifestyle changes. -Discussed impact of weight loss on comorbidities including blood pressure and joint pain. -Encouraged continued adherence to weight management strategies.  HIP JOINT PAIN: Chronic hip joint pain previously treated with physical therapy. Significant improvement with increased mobility and reduced pain. -Reinforced importance of continued movement and physical therapy  exercises. -Advised maintenance of regular activity and adherence to physical therapy recommendations. -Encouraged reporting of any changes in symptoms.  PARASPINAL MUSCLE SPASM: Chronic paraspinal muscle spasm likely related to tension. Psychotherapy providing significant benefit in addressing underlying stress and tension. -Encouraged continuation of psychotherapy.  GASTROESOPHAGEAL REFLUX DISEASE: Gastroesophageal reflux disease well controlled on famotidine. Recent endoscopy showed no evidence of Barrett's esophagus; a small ulcer was noted but no further endoscopic follow-up is required at this time. -Continued famotidine therapy. -No repeat endoscopy indicated per gastroenterology recommendations. -Advised reporting of any changes in symptoms.  GENERALIZED ANXIETY DISORDER AND DEPRESSION: Generalized anxiety disorder and depression, well-managed on buspirone  and escitalopram . Significant improvement in mental health and resolution of longstanding issues with psychotherapy. -Continued buspirone  and escitalopram  at current doses. -Encouraged ongoing participation in psychotherapy. -Reinforced importance of therapeutic relationship and connection with current therapist.

## 2024-06-02 ENCOUNTER — Ambulatory Visit: Payer: Self-pay | Admitting: Family Medicine

## 2024-06-02 ENCOUNTER — Ambulatory Visit (INDEPENDENT_AMBULATORY_CARE_PROVIDER_SITE_OTHER)

## 2024-06-02 DIAGNOSIS — F418 Other specified anxiety disorders: Secondary | ICD-10-CM

## 2024-06-02 LAB — COMPREHENSIVE METABOLIC PANEL WITH GFR
ALT: 31 IU/L (ref 0–32)
AST: 23 IU/L (ref 0–40)
Albumin: 4.4 g/dL (ref 3.9–4.9)
Alkaline Phosphatase: 45 IU/L (ref 41–116)
BUN/Creatinine Ratio: 16 (ref 9–23)
BUN: 13 mg/dL (ref 6–24)
Bilirubin Total: 0.3 mg/dL (ref 0.0–1.2)
CO2: 22 mmol/L (ref 20–29)
Calcium: 9.4 mg/dL (ref 8.7–10.2)
Chloride: 103 mmol/L (ref 96–106)
Creatinine, Ser: 0.83 mg/dL (ref 0.57–1.00)
Globulin, Total: 2.3 g/dL (ref 1.5–4.5)
Glucose: 82 mg/dL (ref 70–99)
Potassium: 4.3 mmol/L (ref 3.5–5.2)
Sodium: 137 mmol/L (ref 134–144)
Total Protein: 6.7 g/dL (ref 6.0–8.5)
eGFR: 86 mL/min/1.73

## 2024-06-02 LAB — LIPID PANEL
Chol/HDL Ratio: 2.7 ratio (ref 0.0–4.4)
Cholesterol, Total: 175 mg/dL (ref 100–199)
HDL: 64 mg/dL
LDL Chol Calc (NIH): 95 mg/dL (ref 0–99)
Triglycerides: 90 mg/dL (ref 0–149)
VLDL Cholesterol Cal: 16 mg/dL (ref 5–40)

## 2024-06-02 LAB — CBC
Hematocrit: 41.3 % (ref 34.0–46.6)
Hemoglobin: 13.7 g/dL (ref 11.1–15.9)
MCH: 29.8 pg (ref 26.6–33.0)
MCHC: 33.2 g/dL (ref 31.5–35.7)
MCV: 90 fL (ref 79–97)
Platelets: 133 x10E3/uL — ABNORMAL LOW (ref 150–450)
RBC: 4.59 x10E6/uL (ref 3.77–5.28)
RDW: 12.8 % (ref 11.7–15.4)
WBC: 7 x10E3/uL (ref 3.4–10.8)

## 2024-06-02 LAB — HEMOGLOBIN A1C
Est. average glucose Bld gHb Est-mCnc: 103 mg/dL
Hgb A1c MFr Bld: 5.2 % (ref 4.8–5.6)

## 2024-06-02 NOTE — Telephone Encounter (Signed)
 FYI

## 2024-06-08 NOTE — Progress Notes (Unsigned)
 "  THERAPIST PROGRESS NOTE  Session Time: 81  Participation Level: Active  Behavioral Response: CasualAlertEuthymic  Type of Therapy: Individual Therapy  Treatment Goals addressed: STG: Vaishnavi will participate in therapy to identify root causes of anxiety and learn and develop skills to manage symptoms to reduce reactivity caused by anxiety from daily to no more than three times a week.  STG: Tavaria will participate in therapy to learn and develop skills to reduce the impact of the daily symptoms of depression from daily to no more than three times a week.   ProgressTowards Goals: Progressing  Interventions: CBT Motivational Interviewing  Summary: Alexandra Salinas is a 49 y.o. female who presents with a history of anxiety. Therapist greeted Alexandra Salinas warmly and spent a few minutes checking in and discussing how things have been since the last session.  Alexandra Salinas presents for session alert and oriented; mood and affect neutral. Speech is clear and coherent at normal rate and tone. Engaged and cooperative in session. Therapist spent the first part of session reviewing treatment plan and goals to assess management of behavioral symptoms and any safety concerns and current level of functioning.  Alexandra Salinas denies all safety concerns and continues to work towards goals.    Therapist spent the second part of session on psychoeducation, skill development and therapeutic interventions in session.  Alexandra Salinas shared that she continues to do well and has noticed an improvement in the way she is responding to situations since EMDR.  She processed that she has noticed she is not always emotionally available for her daughter and would like to understand what causes her to be this way and find ways to be more present with her.  She shared that she had times feels that her daughter is ungrateful for all the blessings that she has which Alexandra Salinas never had at her age.  Therapist used some motivational interviewing and addressed  where some of the thoughts and feelings are coming from.  CBT was used to process thought patterns. MI was used  to get Alexandra Salinas to think through the different types of experiences her daughter has had and Alexandra Salinas had asked Alexandra Salinas had experienced quite a bit of trauma and neglect in her childhood and her ways of experiencing the world are very different from her daughters. This was processed.  Therapist helped Alexandra Salinas to recognize how she could not look at her daughters experiences in life from a similar lands as her own experiences and this was a positive because as a mother Alexandra Salinas had protected her daughter from a lot of trauma and had broken some of the patterns of trauma and neglect which was positive.  Alexandra Salinas responded well to the interventions and shared that the shifting perspective helped her accept her daughter as she was instead of comparing her to her younger self. A wrap up meditation activity was done at the end of the session and Alexandra Salinas responded well to it.   Suicidal/Homicidal: No without intent/plan  Therapist Response: Therapist used Motivational Interviewing to facilitate discussion, elicit pertinent information and summarized thoughts and feelings for clarity and accuracy. Used supportive language and validation to provide a safe space to process thoughts and feelings. Therapist used CBT and motivational interviewing in session as therapeutic techniques to address presenting concerns. Therapist noted that Alexandra Salinas was having a hard time recognizing her daughter's behavior as typical 63-year-old behavior and was comparing it to her own personal experiences as a child.  Therapist provided space to process some of Alexandra Salinas's personal traumatic experiences  and use therapeutic techniques to help her begin the process of resolving past trauma.  Therapist reviewed and summarized what was discussed in session today and asked Alexandra Salinas if there were any questions and provided clarity as needed. No safety concerns were  noted during session  and SI/HI/AVH were not present. Homework was assigned. Therapist wrapped up with a mindfulness based  activity and a follow up meeting time was scheduled.   Plan: Return again in 1 weeks.  Diagnosis: Other specified anxiety disorders  Adjustment disorder, unspecified type  Collaboration of Care: Medication Management AEB chart review.    Patient/Guardian was advised Release of Information must be obtained prior to any record release in order to collaborate their care with an outside provider. Patient/Guardian was advised if they have not already done so to contact the registration department to sign all necessary forms in order for us  to release information regarding their care.   Consent: Patient/Guardian gives verbal consent for treatment and assignment of benefits for services provided during this visit. Patient/Guardian expressed understanding and agreed to proceed.   Curtiss RAYMOND Carrie, [facility] 06/09/2024  "

## 2024-06-09 ENCOUNTER — Ambulatory Visit (INDEPENDENT_AMBULATORY_CARE_PROVIDER_SITE_OTHER)

## 2024-06-09 DIAGNOSIS — F418 Other specified anxiety disorders: Secondary | ICD-10-CM

## 2024-06-09 DIAGNOSIS — F4320 Adjustment disorder, unspecified: Secondary | ICD-10-CM

## 2024-06-15 ENCOUNTER — Ambulatory Visit
Admission: EM | Admit: 2024-06-15 | Discharge: 2024-06-15 | Disposition: A | Discharge status: Home / Self Care | Attending: Physician Assistant | Admitting: Physician Assistant

## 2024-06-15 ENCOUNTER — Telehealth

## 2024-06-15 DIAGNOSIS — R630 Anorexia: Secondary | ICD-10-CM | POA: Diagnosis not present

## 2024-06-15 DIAGNOSIS — R1032 Left lower quadrant pain: Secondary | ICD-10-CM

## 2024-06-15 DIAGNOSIS — K5732 Diverticulitis of large intestine without perforation or abscess without bleeding: Secondary | ICD-10-CM

## 2024-06-15 MED ORDER — AMOXICILLIN-POT CLAVULANATE 875-125 MG PO TABS: 1.0000 | ORAL_TABLET | Freq: Two times a day (BID) | ORAL | 0 refills | Status: AC

## 2024-06-15 NOTE — ED Provider Notes (Signed)
 " [facility]    CSN: [id number] Arrival date & time: 06/15/24  1236      History   Chief Complaint Chief Complaint  Patient presents with   Abdominal Pain    HPI Alexandra Salinas is a 49 y.o. female presenting for left lower quadrant abdominal pain x 3 days.  She reports associated nausea without vomiting, reduced appetite and constipation.  She says it has been a few days since her last BM.  Denies diarrhea, blood in stool, dysuria, frequency, urgency, hematuria, vaginal discharge.  Patient believes her symptoms are consistent with diverticulitis.  She would like to try an antibiotic at this time.  No relief with heating pad and Tylenol.  HPI  Past Medical History:  Diagnosis Date   Anxiety    Basal cell carcinoma 2023   right forehead, Mohs   Carpal tunnel syndrome on left 06/12/2022   Depression    Diverticulitis    GERD (gastroesophageal reflux disease)    H/O basal cell carcinoma excision 05/23/2023    Patient Active Problem List   Diagnosis Date Noted   Gastroesophageal reflux disease 06/01/2024   Other specified anxiety disorders 05/12/2024   Recurrent major depressive disorder, in full remission 05/12/2024   Adjustment disorder 05/12/2024   Ocular migraine 01/24/2024   Family history of colon cancer in father 12/09/2023   Polyp of esophagus 12/09/2023   Obesity with serious comorbidity 08/30/2023   Healthcare maintenance 05/23/2023   Depression with anxiety 05/23/2023   Mixed hyperlipidemia 04/02/2023   Encounter for weight management 10/18/2022   Low serum vitamin D  05/21/2022   Cervical paraspinal muscle spasm 06/07/2021   Perimenopausal symptoms 05/02/2021   Primary osteoarthritis of hips, bilateral 03/20/2021    Past Surgical History:  Procedure Laterality Date   BASAL CELL CARCINOMA EXCISION  10/2021   forehead   CESAREAN SECTION  2021   COLONOSCOPY  2020   COLONOSCOPY N/A 12/09/2023   Procedure: COLONOSCOPY;  Surgeon: Jinny Carmine, MD;  Location: [facility] ENDOSCOPY;  Service: Endoscopy;  Laterality: N/A;   ESOPHAGOGASTRODUODENOSCOPY  11/2020   ESOPHAGOGASTRODUODENOSCOPY N/A 12/09/2023   Procedure: EGD (ESOPHAGOGASTRODUODENOSCOPY);  Surgeon: Jinny Carmine, MD;  Location: [facility] ENDOSCOPY;  Service: Endoscopy;  Laterality: N/A;   REFRACTIVE SURGERY Bilateral 2015    OB History     Gravida  3   Para      Term      Preterm      AB  2   Living  1      SAB  2   IAB      Ectopic      Multiple      Live Births  1        Obstetric Comments  C-section x 1          Home Medications    Prior to Admission medications  Medication Sig Start Date End Date Taking? Authorizing Provider  amoxicillin -clavulanate (AUGMENTIN ) 875-125 MG tablet Take 1 tablet by mouth every 12 (twelve) hours for 7 days. 06/15/24 06/22/24 Yes Arvis Huxley B, PA-C  busPIRone  (BUSPAR ) 5 MG tablet Take 1 tablet (5 mg total) by mouth 2 (two) times daily. 02/24/24 02/23/25  Eappen, Saramma, MD  cholecalciferol (VITAMIN D3) 25 MCG (1000 UNIT) tablet Take 1,000 Units by mouth daily.    [provider]  escitalopram  (LEXAPRO ) 20 MG tablet Take 1 tablet by mouth once daily with breakfast 03/13/24   Eappen, Saramma, MD  estradiol (ESTRACE) 0.1 MG/GM vaginal cream Place  1 Applicatorful vaginally daily.    [provider]  estradiol (VIVELLE-DOT) 0.1 MG/24HR patch Place 1 patch onto the skin 2 (two) times a week.    [provider]  famotidine (PEPCID) 20 MG tablet Take 20 mg by mouth 2 (two) times daily.    [provider]  progesterone (PROMETRIUM) 100 MG capsule Take 100 mg by mouth daily.    [provider]  TURMERIC PO Take by mouth.    [provider]    Family History Family History  Problem Relation Age of Onset   Anxiety disorder Mother    Depression Mother    Kidney disease Mother    Hypertension Mother    Diabetes Mother    Vascular Disease Mother    Dementia Mother     Stroke Mother    Heart disease Father    Colon cancer Father    Drug abuse Brother    Hyperlipidemia Brother    Hypertension Brother    Depression Brother    Alcoholism Brother    Depression Maternal Aunt    Anxiety disorder Maternal Aunt    Anxiety disorder Maternal Uncle    Depression Maternal Uncle    Stroke Maternal Grandfather    Diabetes Maternal Grandmother    Heart disease Maternal Grandmother    Lung cancer Paternal Grandmother     Social History Social History[1]   Allergies   Patient has no known allergies.   Review of Systems Review of Systems  Constitutional:  Positive for appetite change. Negative for chills, diaphoresis, fatigue and fever.  Gastrointestinal:  Positive for abdominal pain, constipation and nausea. Negative for abdominal distention, anal bleeding, blood in stool, diarrhea, rectal pain and vomiting.  Genitourinary:  Negative for difficulty urinating, dysuria, frequency and hematuria.  Musculoskeletal:  Negative for back pain.  Neurological:  Negative for weakness.     Physical Exam Triage Vital Signs ED Triage Vitals  Encounter Vitals Group     BP 06/15/24 1253 118/75     Girls Systolic BP Percentile --      Girls Diastolic BP Percentile --      Boys Systolic BP Percentile --      Boys Diastolic BP Percentile --      Pulse Rate 06/15/24 1253 86     Resp 06/15/24 1253 16     Temp 06/15/24 1253 98.3 F (36.8 C)     Temp Source 06/15/24 1253 Oral     SpO2 06/15/24 1253 98 %     Weight --      Height --      Head Circumference --      Peak Flow --      Pain Score 06/15/24 1252 5     Pain Loc --      Pain Education --      Exclude from Growth Chart --    No data found.  Updated Vital Signs BP 118/75 (BP Location: Right Arm)   Pulse 86   Temp 98.3 F (36.8 C) (Oral)   Resp 16   LMP 06/15/2024 (Approximate)   SpO2 98%      Physical Exam Vitals and nursing note reviewed.  Constitutional:      General: She is not in acute  distress.    Appearance: Normal appearance. She is well-developed. She is not ill-appearing or toxic-appearing.  HENT:     Head: Normocephalic and atraumatic.  Eyes:     General: No scleral icterus.  Right eye: No discharge.        Left eye: No discharge.     Conjunctiva/sclera: Conjunctivae normal.  Cardiovascular:     Rate and Rhythm: Normal rate and regular rhythm.     Heart sounds: Normal heart sounds.  Pulmonary:     Effort: Pulmonary effort is normal. No respiratory distress.     Breath sounds: Normal breath sounds.  Abdominal:     Palpations: Abdomen is soft.     Tenderness: There is abdominal tenderness in the left lower quadrant. There is no right CVA tenderness, left CVA tenderness, guarding or rebound.  Musculoskeletal:     Cervical back: Neck supple.  Skin:    General: Skin is dry.  Neurological:     General: No focal deficit present.     Mental Status: She is alert. Mental status is at baseline.     Motor: No weakness.     Gait: Gait normal.  Psychiatric:        Mood and Affect: Mood normal.        Behavior: Behavior normal.      UC Treatments / Results  Labs (all labs ordered are listed, but only abnormal results are displayed) Labs Reviewed - No data to display  EKG   Radiology No results found.  Procedures Procedures (including critical care time)  Medications Ordered in UC Medications - No data to display  Initial Impression / Assessment and Plan / UC Course  I have reviewed the triage vital signs and the nursing notes.  Pertinent labs & imaging results that were available during my care of the patient were reviewed by me and considered in my medical decision making (see chart for details).   49 year old female presents for left lower quadrant abdominal pain, nausea, reduced appetite x 3 days.  No fever or urinary symptoms.  Reports being constipated.  No blood in stool or diarrhea.  Patient believes her symptoms are consistent with  diverticulitis as she has had flareups in the past.  Vitals are all normal and stable and patient is overall well-appearing.  She does have tenderness of the left lower quadrant without guarding or rebound.  No CVA tenderness.  Advised patient I do not have access to stat CT or stat labs to 100% be able to diagnose diverticulitis.  She is confident that she has diverticulitis and would like to try an antibiotic.  I discussed that I could provide her with antibiotic but if symptoms are not improving within a couple days, she develops a fever or worsening pain she must go to the emergency department to have CT and labs performed.  She is agreeable to plan.  She does not want to take Cipro as it has made her sick in the past.  Will treat at this time with Augmentin .  Advised bland foods, rest and fluids and again reviewed ED precautions.    Final Clinical Impressions(s) / UC Diagnoses   Final diagnoses:  Abdominal pain, left lower quadrant  Diverticulitis of colon  Loss of appetite     Discharge Instructions      - Possible diverticulitis given history. - You understand I do not have access to stat CT to confirm this or stat labs. - We discussed trying the antibiotics but if you are not improving in 2 to 3 days or if pain, fever or worsening symptoms you should go to the emergency department to have a CT scan and labs.  We discussed possibility of perforation and abscess  if you do have underlying diverticulitis and is not treated appropriately.  We also discussed other potential causes of your pain such as constipation.  I suggest senna vegetable laxative, rest and fluids, bland diet.   ED Prescriptions     Medication Sig Dispense Auth. Provider   amoxicillin -clavulanate (AUGMENTIN ) 875-125 MG tablet Take 1 tablet by mouth every 12 (twelve) hours for 7 days. 14 tablet Nahshon Reich B, PA-C      PDMP not reviewed this encounter.    [1]  Social History Tobacco Use   Smoking status:  Former    Current packs/day: 0.00    Average packs/day: 1 pack/day for 20.0 years (20.0 ttl pk-yrs)    Types: Cigarettes    Start date: 26    Quit date: 2017    Years since quitting: 9.0   Smokeless tobacco: Never  Vaping Use   Vaping status: Never Used  Substance Use Topics   Alcohol use: Yes    Alcohol/week: 7.0 standard drinks of alcohol    Types: 7 Glasses of wine per week    Comment: occ   Drug use: Never     Arvis Jolan NOVAK, PA-C 06/15/24 1340  "

## 2024-06-15 NOTE — ED Triage Notes (Signed)
 Patient presents to UC for LLQ pain x 3 days. Concerned with diverticulitis flare-up. Took tylenol today.   Denies fever, N/V, or diarrhea.

## 2024-06-15 NOTE — Discharge Instructions (Addendum)
-   Possible diverticulitis given history. - You understand I do not have access to stat CT to confirm this or stat labs. - We discussed trying the antibiotics but if you are not improving in 2 to 3 days or if pain, fever or worsening symptoms you should go to the emergency department to have a CT scan and labs.  We discussed possibility of perforation and abscess if you do have underlying diverticulitis and is not treated appropriately.  We also discussed other potential causes of your pain such as constipation.  I suggest senna vegetable laxative, rest and fluids, bland diet.

## 2024-06-16 ENCOUNTER — Other Ambulatory Visit: Payer: Self-pay | Admitting: Psychiatry

## 2024-06-16 ENCOUNTER — Ambulatory Visit

## 2024-06-16 DIAGNOSIS — F3342 Major depressive disorder, recurrent, in full remission: Secondary | ICD-10-CM

## 2024-06-25 ENCOUNTER — Ambulatory Visit (INDEPENDENT_AMBULATORY_CARE_PROVIDER_SITE_OTHER)

## 2024-06-25 DIAGNOSIS — F3342 Major depressive disorder, recurrent, in full remission: Secondary | ICD-10-CM

## 2024-06-25 DIAGNOSIS — F418 Other specified anxiety disorders: Secondary | ICD-10-CM

## 2024-06-25 NOTE — Progress Notes (Signed)
 Virtual Visit via Video Note  I connected with Kasha Howeth Rockers on 06/25/24 at 11:00 AM EST by a video enabled telemedicine application and verified that I am speaking with the correct person using two identifiers.  Location: Patient: [address]  [place] [address]  Provider: Remote Office   I discussed the limitations of evaluation and management by telemedicine and the availability of in person appointments. The patient expressed understanding and agreed to proceed.  History of Present Illness: See below    Observations/Objective: See below   Assessment and Plan: See below   Follow Up Instructions: See below    I discussed the assessment and treatment plan with the patient. The patient was provided an opportunity to ask questions and all were answered. The patient agreed with the plan and demonstrated an understanding of the instructions.   The patient was advised to call back or seek an in-person evaluation if the symptoms worsen or if the condition fails to improve as anticipated.  I provided 59 minutes of non-face-to-face time during this encounter.   Curtiss RAYMOND Carrie, [facility]   THERAPIST PROGRESS NOTE  Session Time: 57  Participation Level: Active  Behavioral Response: CasualAlertEuthymic  Type of Therapy: Individual Therapy  Treatment Goals addressed: STG: Syniyah will participate in therapy to identify root causes of anxiety and learn and develop skills to manage symptoms to reduce reactivity caused by anxiety from daily to no more than three times a week.  STG: Jalisia will participate in therapy to learn and develop skills to reduce the impact of the daily symptoms of depression from daily to no more than three times a week.   ProgressTowards Goals: Progressing  Interventions: CBT and Motivational Interviewing  Summary: Kyndahl Anne Karnes is a 49 y.o. female who presents with a history of depression and anxiety. Therapist greeted Loralee  warmly and spent a few minutes checking in and discussing how things have been since the last session.  Yetzali presents for session alert and oriented; mood and affect neutral. Speech is clear and coherent at normal rate and tone. Engaged and cooperative in session. Therapist spent the first part of session reviewing treatment plan and goals to assess management of behavioral symptoms and any safety concerns and current level of functioning.  Mikaili denies all safety concerns and continues to work towards goals.    Therapist spent the second part of session on psychoeducation, skill development and therapeutic interventions in session.  Kathe shared a couple of situations that have troubled her since last meeting.  1 was a pattern of irritability she feels towards her husband about household chores and the second was feeling on the outside of interactions with her family.  Therapist explored and processed the situation with her using OARS and identified underlying anxiety that leads her to become controlling.  Therapist used some EMDR to process root causes of anxiety and helped her recognize patterns using CBT and challenging of irrational thought patterns.  Maicie responded well to the interventions and shared she was able to see her own controlling behaviors and will continue to work to recognize them in the moment. A wrap up meditation activity was done at the end of the session and she responded well to it.   Suicidal/Homicidal: Nowithout intent/plan  Therapist Response: Therapist used Motivational Interviewing to facilitate discussion, elicit pertinent information and summarized thoughts and feelings for clarity and accuracy. Used supportive language and validation to provide a safe space to process thoughts and feelings. Therapist used  additional therapeutic techniques in session to address presenting concerns.  The ongoing treatment plan includes: therapeutic work on addressing dysfunctional coping  strategies and mechanisms; and helping the patient gain greater awareness, understanding, and expression of underlying emotions and develop effective and functional coping strategies to reduce impact of symptoms on daily living. Treatment continues to show good evolution and development. Treatment to continue as indicated.   Therapist reviewed and summarized what was discussed in session today and asked Sherral if there were any questions and provided clarity as needed. No safety concerns were noted during session and SI/HI/AVH were not present. Homework was assigned and a follow up meeting time was scheduled.   Plan: Return again in 1 weeks.  Diagnosis: Other specified anxiety disorders  Recurrent major depressive disorder, in full remission  Collaboration of Care: Medication Management AEB chart review  Patient/Guardian was advised Release of Information must be obtained prior to any record release in order to collaborate their care with an outside provider. Patient/Guardian was advised if they have not already done so to contact the registration department to sign all necessary forms in order for us  to release information regarding their care.   Consent: Patient/Guardian gives verbal consent for treatment and assignment of benefits for services provided during this visit. Patient/Guardian expressed understanding and agreed to proceed.   Curtiss RAYMOND Carrie, [facility] 06/25/2024

## 2024-06-30 ENCOUNTER — Ambulatory Visit

## 2024-07-10 ENCOUNTER — Ambulatory Visit

## 2024-07-15 ENCOUNTER — Ambulatory Visit

## 2024-08-12 ENCOUNTER — Telehealth: Admitting: Psychiatry

## 2024-08-28 ENCOUNTER — Ambulatory Visit

## 2024-09-22 ENCOUNTER — Encounter: Admitting: Dermatology

## 2025-06-07 ENCOUNTER — Encounter: Admitting: Family Medicine
